# Patient Record
Sex: Male | Born: 1970
Health system: Southern US, Community
[De-identification: ages and names within clinical notes are randomized; demographics above are authoritative.]

## PROBLEM LIST (undated history)

## (undated) DIAGNOSIS — E11621 Type 2 diabetes mellitus with foot ulcer: Secondary | ICD-10-CM

## (undated) DIAGNOSIS — L97502 Non-pressure chronic ulcer of other part of unspecified foot with fat layer exposed: Secondary | ICD-10-CM

## (undated) DIAGNOSIS — E119 Type 2 diabetes mellitus without complications: Secondary | ICD-10-CM

## (undated) HISTORY — DX: Non-pressure chronic ulcer of other part of unspecified foot with fat layer exposed: L97.502

## (undated) HISTORY — DX: Type 2 diabetes mellitus with foot ulcer: E11.621

---

## 2006-06-20 ENCOUNTER — Emergency Department (HOSPITAL_COMMUNITY): Admission: EM | Admit: 2006-06-20 | Discharge: 2006-06-20 | Payer: Self-pay | Admitting: Emergency Medicine

## 2016-09-10 ENCOUNTER — Encounter (HOSPITAL_BASED_OUTPATIENT_CLINIC_OR_DEPARTMENT_OTHER): Payer: Self-pay | Admitting: *Deleted

## 2016-09-10 ENCOUNTER — Emergency Department (HOSPITAL_BASED_OUTPATIENT_CLINIC_OR_DEPARTMENT_OTHER)
Admission: EM | Admit: 2016-09-10 | Discharge: 2016-09-10 | Disposition: A | Discharge status: Home / Self Care | Payer: Self-pay | Attending: Emergency Medicine | Admitting: Emergency Medicine

## 2016-09-10 DIAGNOSIS — F17290 Nicotine dependence, other tobacco product, uncomplicated: Secondary | ICD-10-CM | POA: Insufficient documentation

## 2016-09-10 DIAGNOSIS — E1165 Type 2 diabetes mellitus with hyperglycemia: Secondary | ICD-10-CM | POA: Insufficient documentation

## 2016-09-10 DIAGNOSIS — R739 Hyperglycemia, unspecified: Secondary | ICD-10-CM

## 2016-09-10 DIAGNOSIS — I10 Essential (primary) hypertension: Secondary | ICD-10-CM | POA: Insufficient documentation

## 2016-09-10 LAB — URINALYSIS, ROUTINE W REFLEX MICROSCOPIC
Bilirubin Urine: NEGATIVE
Glucose, UA: >=500 mg/dL — AB
Hgb urine dipstick: NEGATIVE
KETONES UR: NEGATIVE mg/dL
LEUKOCYTES UA: NEGATIVE
NITRITE: NEGATIVE
PH: 5.5 (ref 5.0–8.0)
Protein, ur: NEGATIVE mg/dL
SPECIFIC GRAVITY, URINE: 1.038 — AB (ref 1.005–1.030)

## 2016-09-10 LAB — BASIC METABOLIC PANEL
ANION GAP: 9 (ref 5–15)
BUN: 15 mg/dL (ref 6–20)
CALCIUM: 9.3 mg/dL (ref 8.9–10.3)
CHLORIDE: 98 mmol/L — AB (ref 101–111)
CO2: 27 mmol/L (ref 22–32)
Creatinine, Ser: 0.82 mg/dL (ref 0.61–1.24)
GFR calc non Af Amer: >60 mL/min (ref >60)
Glucose, Bld: 373 mg/dL — ABNORMAL HIGH (ref 65–99)
POTASSIUM: 4.3 mmol/L (ref 3.5–5.1)
Sodium: 134 mmol/L — ABNORMAL LOW (ref 135–145)

## 2016-09-10 LAB — CBC
HEMATOCRIT: 45.5 % (ref 39.0–52.0)
HEMOGLOBIN: 16.3 g/dL (ref 13.0–17.0)
MCH: 31.8 pg (ref 26.0–34.0)
MCHC: 35.8 g/dL (ref 30.0–36.0)
MCV: 88.9 fL (ref 78.0–100.0)
Platelets: 282 10*3/uL (ref 150–400)
RBC: 5.12 MIL/uL (ref 4.22–5.81)
RDW: 12 % (ref 11.5–15.5)
WBC: 11.6 10*3/uL — AB (ref 4.0–10.5)

## 2016-09-10 LAB — URINALYSIS, MICROSCOPIC (REFLEX)

## 2016-09-10 LAB — CBG MONITORING, ED: GLUCOSE-CAPILLARY: 358 mg/dL — AB (ref 65–99)

## 2016-09-10 MED ORDER — METFORMIN HCL 500 MG PO TABS
500.0000 mg | ORAL_TABLET | Freq: Once | ORAL | Status: AC
  Administered 2016-09-10: 500 mg via ORAL
  Filled 2016-09-10: qty 1

## 2016-09-10 MED ORDER — METFORMIN HCL 500 MG PO TABS: 500.0000 mg | ORAL_TABLET | Freq: Every day | ORAL | 0 refills | Status: DC

## 2016-09-10 MED ORDER — HYDROCHLOROTHIAZIDE 25 MG PO TABS
25.0000 mg | ORAL_TABLET | Freq: Once | ORAL | Status: AC
  Administered 2016-09-10: 25 mg via ORAL
  Filled 2016-09-10: qty 1

## 2016-09-10 MED ORDER — HYDROCHLOROTHIAZIDE 25 MG PO TABS: 25.0000 mg | ORAL_TABLET | Freq: Every day | ORAL | 0 refills | Status: DC

## 2016-09-10 MED FILL — metFORMIN HCL 500 MG TABS: 500 | 30 days supply | Qty: 30 | Fill #0

## 2016-09-10 MED FILL — HYDROCHLOROTHIAZIDE 25 MG T: 25 | 30 days supply | Qty: 30 | Fill #0

## 2016-09-10 NOTE — ED Provider Notes (Signed)
[facility] Provider Note   CSN: [id number] Arrival date & time: 09/10/16  1220     History   Chief Complaint Chief Complaint  Patient presents with  . Hyperglycemia    HPI Jason Monroe is a 46 y.o. male.  HPI  46 y.o. male, presents to the Emergency Department today due to pain in bilateral feet. Pt seen by PCP and told he had new onset DM and told to come to ED last week. Noted hyperglycemia and elevated BP. Denies headaches. Notes mild visual changes earlier, that have resolved. Notes increased urinary frequency and thirst. No CP/SOB/ABD pain. No N/V/D. No fevers. Denies pain in general. No meds PTA. Noted extensive FH of DM. No other symptoms noted.    History reviewed. No pertinent past medical history.  There are no active problems to display for this patient.   History reviewed. No pertinent surgical history.     Home Medications    Prior to Admission medications   Not on File    Family History No family history on file.  Social History Social History  Substance Use Topics  . Smoking status: Current Every Day Smoker    Types: Pipe  . Smokeless tobacco: Never Used  . Alcohol use Yes     Allergies   Patient has no known allergies.   Review of Systems Review of Systems ROS reviewed and all are negative for acute change except as noted in the HPI.  Physical Exam Updated Vital Signs BP (!) 183/115   Pulse 86   Temp 98.5 F (36.9 C) (Oral)   Resp 20   Ht 6' (1.829 m)   Wt 111.1 kg (245 lb)   SpO2 99%   BMI 33.23 kg/m   Physical Exam  Constitutional: He is oriented to person, place, and time. Vital signs are normal. He appears well-developed and well-nourished. No distress.  HENT:  Head: Normocephalic and atraumatic.  Right Ear: Hearing, tympanic membrane, external ear and ear canal normal.  Left Ear: Hearing, tympanic membrane, external ear and ear canal normal.  Nose: Nose normal.  Mouth/Throat: Uvula is midline,  oropharynx is clear and moist and mucous membranes are normal. No trismus in the jaw. No oropharyngeal exudate, posterior oropharyngeal erythema or tonsillar abscesses.  Eyes: Pupils are equal, round, and reactive to light. Conjunctivae and EOM are normal.  Neck: Normal range of motion. Neck supple. No tracheal deviation present.  Cardiovascular: Normal rate, regular rhythm, S1 normal, S2 normal, normal heart sounds, intact distal pulses and normal pulses.   Pulmonary/Chest: Effort normal and breath sounds normal. No respiratory distress. He has no decreased breath sounds. He has no wheezes. He has no rhonchi. He has no rales.  Abdominal: Normal appearance and bowel sounds are normal. There is no tenderness.  Musculoskeletal: Normal range of motion.  Neurological: He is alert and oriented to person, place, and time.  Skin: Skin is warm and dry.  Psychiatric: He has a normal mood and affect. His speech is normal and behavior is normal. Thought content normal.  Nursing note and vitals reviewed.  ED Treatments / Results  Labs (all labs ordered are listed, but only abnormal results are displayed) Labs Reviewed  CBC - Abnormal; Notable for the following:       Result Value   WBC 11.6 (*)    All other components within normal limits  BASIC METABOLIC PANEL - Abnormal; Notable for the following:    Sodium 134 (*)    Chloride 98 (*)  Glucose, Bld 373 (*)    All other components within normal limits  URINALYSIS, ROUTINE W REFLEX MICROSCOPIC - Abnormal; Notable for the following:    Specific Gravity, Urine 1.038 (*)    Glucose, UA >=500 (*)    All other components within normal limits  URINALYSIS, MICROSCOPIC (REFLEX) - Abnormal; Notable for the following:    Bacteria, UA RARE (*)    Squamous Epithelial / LPF 0-5 (*)    All other components within normal limits  CBG MONITORING, ED - Abnormal; Notable for the following:    Glucose-Capillary 358 (*)    All other components within normal  limits    EKG  EKG Interpretation None       Radiology No results found.  Procedures Procedures (including critical care time)  Medications Ordered in ED Medications - No data to display   Initial Impression / Assessment and Plan / ED Course  I have reviewed the triage vital signs and the nursing notes.  Pertinent labs & imaging results that were available during my care of the patient were reviewed by me and considered in my medical decision making (see chart for details).  Final Clinical Impressions(s) / ED Diagnoses  {I have reviewed and evaluated the relevant laboratory values.   {I have reviewed the relevant previous healthcare records.  {I obtained HPI from historian. {Patient discussed with supervising physician.  ED Course:  Assessment: Pt is a 46 y.o. male presents to the Emergency Department today due to pain in bilateral feet. Pt seen by PCP and told he had new onset DM and told to come to ED last week. Noted hyperglycemia and elevated BP. Denies headaches. Notes mild visual changes earlier, that have resolved. Notes increased urinary frequency and thirst. No CP/SOB/ABD pain. No N/V/D. No fevers. Denies pain in general. No meds PTA. Noted extensive FH of DM. On exam, pt in NAD. Nontoxic/nonseptic appearing. VSS. Noted HTN with BP 183/115. Afebrile. Lungs CTA. Heart RRR. Abdomen nontender soft. POC glucose 400. CBC unremarkable. BMP unremarkable. No gap.Marland Kitchen UA without ketones. Given Metformin and HCTZ in ED. Plan is to DC home with both medications and follow up with PCP. Encouraged exercise and diet. At time of discharge, Patient is in no acute distress. Vital Signs are stable. Patient is able to ambulate. Patient able to tolerate PO.   Disposition/Plan:  DC Home Additional Verbal discharge instructions given and discussed with patient.  Pt Instructed to f/u with PCP in the next week for evaluation and treatment of symptoms. Return precautions given Pt acknowledges and  agrees with plan  Supervising Physician Jerelyn Scott, MD  Final diagnoses:  Hyperglycemia  Hypertension, unspecified type    New Prescriptions New Prescriptions   No medications on file     Audry Pili, Cordelia Poche 09/10/16 1446    Jerelyn Scott, MD 09/10/16 1453

## 2016-09-10 NOTE — ED Triage Notes (Signed)
States he went to see his MD a week ago for pain in his feet. He was told he has new onset diabetes and was told to come to the ED. He has not had a chance to come until today.

## 2016-09-10 NOTE — Discharge Instructions (Signed)
Please read and follow all provided instructions.  Your diagnoses today include:  1. Hyperglycemia   2. Hypertension, unspecified type     Tests performed today include: Vital signs. See below for your results today.   Medications prescribed:  Take as prescribed   Home care instructions:  Follow any educational materials contained in this packet.  Follow-up instructions: Please follow-up with your primary care provider for further evaluation of symptoms and treatment   Return instructions:  Please return to the Emergency Department if you do not get better, if you get worse, or new symptoms OR  - Fever (temperature greater than 101.44F)  - Bleeding that does not stop with holding pressure to the area    -Severe pain (please note that you may be more sore the day after your accident)  - Chest Pain  - Difficulty breathing  - Severe nausea or vomiting  - Inability to tolerate food and liquids  - Passing out  - Skin becoming red around your wounds  - Change in mental status (confusion or lethargy)  - New numbness or weakness    Please return if you have any other emergent concerns.  Additional Information:  Your vital signs today were: BP (!) 183/115    Pulse 86    Temp 98.5 F (36.9 C) (Oral)    Resp 20    Ht 6' (1.829 m)    Wt 111.1 kg (245 lb)    SpO2 99%    BMI 33.23 kg/m  If your blood pressure (BP) was elevated above 135/85 this visit, please have this repeated by your doctor within one month. ---------------

## 2017-10-23 ENCOUNTER — Emergency Department (HOSPITAL_COMMUNITY): Payer: Self-pay

## 2017-10-23 ENCOUNTER — Other Ambulatory Visit: Payer: Self-pay

## 2017-10-23 ENCOUNTER — Encounter (HOSPITAL_COMMUNITY): Payer: Self-pay | Admitting: Emergency Medicine

## 2017-10-23 ENCOUNTER — Inpatient Hospital Stay (HOSPITAL_COMMUNITY)
Admission: EM | Admit: 2017-10-23 | Discharge: 2017-10-28 | DRG: 854 | Disposition: A | Discharge status: Home / Self Care | Payer: Self-pay | Attending: Internal Medicine | Admitting: Internal Medicine

## 2017-10-23 DIAGNOSIS — L97519 Non-pressure chronic ulcer of other part of right foot with unspecified severity: Secondary | ICD-10-CM

## 2017-10-23 DIAGNOSIS — E440 Moderate protein-calorie malnutrition: Secondary | ICD-10-CM | POA: Diagnosis present

## 2017-10-23 DIAGNOSIS — L02611 Cutaneous abscess of right foot: Secondary | ICD-10-CM | POA: Diagnosis present

## 2017-10-23 DIAGNOSIS — L97502 Non-pressure chronic ulcer of other part of unspecified foot with fat layer exposed: Secondary | ICD-10-CM

## 2017-10-23 DIAGNOSIS — L0390 Cellulitis, unspecified: Secondary | ICD-10-CM

## 2017-10-23 DIAGNOSIS — E1143 Type 2 diabetes mellitus with diabetic autonomic (poly)neuropathy: Secondary | ICD-10-CM

## 2017-10-23 DIAGNOSIS — F17290 Nicotine dependence, other tobacco product, uncomplicated: Secondary | ICD-10-CM | POA: Diagnosis present

## 2017-10-23 DIAGNOSIS — Z833 Family history of diabetes mellitus: Secondary | ICD-10-CM

## 2017-10-23 DIAGNOSIS — Z888 Allergy status to other drugs, medicaments and biological substances status: Secondary | ICD-10-CM

## 2017-10-23 DIAGNOSIS — R609 Edema, unspecified: Secondary | ICD-10-CM | POA: Diagnosis present

## 2017-10-23 DIAGNOSIS — E118 Type 2 diabetes mellitus with unspecified complications: Secondary | ICD-10-CM

## 2017-10-23 DIAGNOSIS — H02401 Unspecified ptosis of right eyelid: Secondary | ICD-10-CM

## 2017-10-23 DIAGNOSIS — L089 Local infection of the skin and subcutaneous tissue, unspecified: Secondary | ICD-10-CM

## 2017-10-23 DIAGNOSIS — L97419 Non-pressure chronic ulcer of right heel and midfoot with unspecified severity: Secondary | ICD-10-CM

## 2017-10-23 DIAGNOSIS — E11628 Type 2 diabetes mellitus with other skin complications: Secondary | ICD-10-CM | POA: Diagnosis present

## 2017-10-23 DIAGNOSIS — L97412 Non-pressure chronic ulcer of right heel and midfoot with fat layer exposed: Secondary | ICD-10-CM

## 2017-10-23 DIAGNOSIS — Z87891 Personal history of nicotine dependence: Secondary | ICD-10-CM

## 2017-10-23 DIAGNOSIS — E11621 Type 2 diabetes mellitus with foot ulcer: Secondary | ICD-10-CM | POA: Diagnosis present

## 2017-10-23 DIAGNOSIS — E1140 Type 2 diabetes mellitus with diabetic neuropathy, unspecified: Secondary | ICD-10-CM | POA: Diagnosis present

## 2017-10-23 DIAGNOSIS — A419 Sepsis, unspecified organism: Principal | ICD-10-CM | POA: Diagnosis present

## 2017-10-23 DIAGNOSIS — Z794 Long term (current) use of insulin: Secondary | ICD-10-CM

## 2017-10-23 DIAGNOSIS — E1165 Type 2 diabetes mellitus with hyperglycemia: Secondary | ICD-10-CM | POA: Diagnosis present

## 2017-10-23 DIAGNOSIS — Z6829 Body mass index (BMI) 29.0-29.9, adult: Secondary | ICD-10-CM

## 2017-10-23 HISTORY — DX: Non-pressure chronic ulcer of other part of unspecified foot with fat layer exposed: L97.502

## 2017-10-23 HISTORY — DX: Type 2 diabetes mellitus without complications: E11.9

## 2017-10-23 HISTORY — DX: Type 2 diabetes mellitus with foot ulcer: E11.621

## 2017-10-23 LAB — CBC
HCT: 42.6 % (ref 39.0–52.0)
Hemoglobin: 14.4 g/dL (ref 13.0–17.0)
MCH: 30.3 pg (ref 26.0–34.0)
MCHC: 33.8 g/dL (ref 30.0–36.0)
MCV: 89.7 fL (ref 78.0–100.0)
PLATELETS: 366 10*3/uL (ref 150–400)
RBC: 4.75 MIL/uL (ref 4.22–5.81)
RDW: 10.7 % — AB (ref 11.5–15.5)
WBC: 27.7 10*3/uL — AB (ref 4.0–10.5)

## 2017-10-23 LAB — COMPREHENSIVE METABOLIC PANEL
ALT: 22 U/L (ref 0–44)
AST: 22 U/L (ref 15–41)
Albumin: 2.9 g/dL — ABNORMAL LOW (ref 3.5–5.0)
Alkaline Phosphatase: 101 U/L (ref 38–126)
Anion gap: 13 (ref 5–15)
BUN: 9 mg/dL (ref 6–20)
CO2: 25 mmol/L (ref 22–32)
Calcium: 8.6 mg/dL — ABNORMAL LOW (ref 8.9–10.3)
Chloride: 92 mmol/L — ABNORMAL LOW (ref 98–111)
Creatinine, Ser: 0.96 mg/dL (ref 0.61–1.24)
GFR calc Af Amer: >60 mL/min (ref >60)
Glucose, Bld: 308 mg/dL — ABNORMAL HIGH (ref 70–99)
Potassium: 4.1 mmol/L (ref 3.5–5.1)
Sodium: 130 mmol/L — ABNORMAL LOW (ref 135–145)
Total Bilirubin: 1.4 mg/dL — ABNORMAL HIGH (ref 0.3–1.2)
Total Protein: 7.6 g/dL (ref 6.5–8.1)

## 2017-10-23 LAB — LACTIC ACID, PLASMA
LACTIC ACID, VENOUS: 0.9 mmol/L (ref 0.5–1.9)
LACTIC ACID, VENOUS: 1.2 mmol/L (ref 0.5–1.9)

## 2017-10-23 LAB — MRSA PCR SCREENING: MRSA BY PCR: NEGATIVE

## 2017-10-23 LAB — URINALYSIS, ROUTINE W REFLEX MICROSCOPIC
Bacteria, UA: NONE SEEN
Bilirubin Urine: NEGATIVE
Glucose, UA: >=500 mg/dL — AB
HGB URINE DIPSTICK: NEGATIVE
Ketones, ur: 80 mg/dL — AB
LEUKOCYTES UA: NEGATIVE
NITRITE: NEGATIVE
Protein, ur: 30 mg/dL — AB
SPECIFIC GRAVITY, URINE: 1.021 (ref 1.005–1.030)
pH: 6 (ref 5.0–8.0)

## 2017-10-23 LAB — HEMOGLOBIN A1C
Hgb A1c MFr Bld: 10.7 % — ABNORMAL HIGH (ref 4.8–5.6)
MEAN PLASMA GLUCOSE: 260.39 mg/dL

## 2017-10-23 LAB — I-STAT CG4 LACTIC ACID, ED: LACTIC ACID, VENOUS: 1.99 mmol/L — AB (ref 0.5–1.9)

## 2017-10-23 LAB — CBG MONITORING, ED: Glucose-Capillary: 284 mg/dL — ABNORMAL HIGH (ref 70–99)

## 2017-10-23 LAB — LIPASE, BLOOD: Lipase: 22 U/L (ref 11–51)

## 2017-10-23 LAB — GLUCOSE, CAPILLARY
GLUCOSE-CAPILLARY: 231 mg/dL — AB (ref 70–99)
Glucose-Capillary: 250 mg/dL — ABNORMAL HIGH (ref 70–99)

## 2017-10-23 MED ORDER — SODIUM CHLORIDE 0.9 % IV BOLUS (SEPSIS): 1000.0000 mL | Freq: Once | INTRAVENOUS | Status: DC

## 2017-10-23 MED ORDER — CIPROFLOXACIN IN D5W 400 MG/200ML IV SOLN
400.0000 mg | Freq: Once | INTRAVENOUS | Status: DC
  Filled 2017-10-23: qty 200

## 2017-10-23 MED ORDER — HYDROCODONE-ACETAMINOPHEN 5-325 MG PO TABS: 1.0000 | ORAL_TABLET | ORAL | Status: DC | PRN

## 2017-10-23 MED ORDER — ENOXAPARIN SODIUM 40 MG/0.4ML ~~LOC~~ SOLN
40.0000 mg | SUBCUTANEOUS | Status: DC
  Administered 2017-10-23 – 2017-10-28 (×6): 40 mg via SUBCUTANEOUS
  Filled 2017-10-23 (×6): qty 0.4

## 2017-10-23 MED ORDER — SODIUM CHLORIDE 0.9 % IV BOLUS (SEPSIS)
1000.0000 mL | Freq: Once | INTRAVENOUS | Status: AC
  Administered 2017-10-23: 1000 mL via INTRAVENOUS

## 2017-10-23 MED ORDER — CHLORHEXIDINE GLUCONATE 4 % EX LIQD: 60.0000 mL | Freq: Once | CUTANEOUS | Status: DC

## 2017-10-23 MED ORDER — CLINDAMYCIN PHOSPHATE 600 MG/50ML IV SOLN
600.0000 mg | Freq: Once | INTRAVENOUS | Status: DC
  Filled 2017-10-23: qty 50

## 2017-10-23 MED ORDER — ONDANSETRON HCL 4 MG/2ML IJ SOLN: 4.0000 mg | Freq: Four times a day (QID) | INTRAMUSCULAR | Status: DC | PRN

## 2017-10-23 MED ORDER — CHLORHEXIDINE GLUCONATE 4 % EX LIQD
60.0000 mL | Freq: Once | CUTANEOUS | Status: AC
  Administered 2017-10-24: 4 via TOPICAL

## 2017-10-23 MED ORDER — INSULIN ASPART 100 UNIT/ML ~~LOC~~ SOLN
0.0000 [IU] | Freq: Every day | SUBCUTANEOUS | Status: DC
  Administered 2017-10-23 – 2017-10-24 (×2): 2 [IU] via SUBCUTANEOUS

## 2017-10-23 MED ORDER — SODIUM CHLORIDE 0.9 % IV SOLN
1000.0000 mL | INTRAVENOUS | Status: DC
  Administered 2017-10-23: 1000 mL via INTRAVENOUS

## 2017-10-23 MED ORDER — SODIUM CHLORIDE 0.9 % IV SOLN: 1000.0000 mL | INTRAVENOUS | Status: DC

## 2017-10-23 MED ORDER — KETOROLAC TROMETHAMINE 30 MG/ML IJ SOLN
30.0000 mg | Freq: Once | INTRAMUSCULAR | Status: AC
  Administered 2017-10-23: 30 mg via INTRAVENOUS
  Filled 2017-10-23: qty 1

## 2017-10-23 MED ORDER — METRONIDAZOLE IN NACL 5-0.79 MG/ML-% IV SOLN
500.0000 mg | Freq: Three times a day (TID) | INTRAVENOUS | Status: DC
  Administered 2017-10-23 – 2017-10-24 (×3): 500 mg via INTRAVENOUS
  Filled 2017-10-23 (×4): qty 100

## 2017-10-23 MED ORDER — SODIUM CHLORIDE 0.9 % IV SOLN
2.0000 g | Freq: Two times a day (BID) | INTRAVENOUS | Status: DC
  Administered 2017-10-23 – 2017-10-24 (×2): 2 g via INTRAVENOUS
  Filled 2017-10-23 (×3): qty 2

## 2017-10-23 MED ORDER — LIVING WELL WITH DIABETES BOOK
Freq: Once | Status: AC
  Administered 2017-10-23: 18:00:00
  Filled 2017-10-23: qty 1

## 2017-10-23 MED ORDER — ONDANSETRON HCL 4 MG PO TABS: 4.0000 mg | ORAL_TABLET | Freq: Four times a day (QID) | ORAL | Status: DC | PRN

## 2017-10-23 MED ORDER — ACETAMINOPHEN 650 MG RE SUPP: 650.0000 mg | Freq: Four times a day (QID) | RECTAL | Status: DC | PRN

## 2017-10-23 MED ORDER — POVIDONE-IODINE 10 % EX SWAB: 2.0000 "application " | Freq: Once | CUTANEOUS | Status: DC

## 2017-10-23 MED ORDER — MORPHINE SULFATE (PF) 4 MG/ML IV SOLN: 4.0000 mg | Freq: Once | INTRAVENOUS | Status: DC

## 2017-10-23 MED ORDER — INSULIN ASPART 100 UNIT/ML ~~LOC~~ SOLN
0.0000 [IU] | Freq: Three times a day (TID) | SUBCUTANEOUS | Status: DC
  Administered 2017-10-23: 5 [IU] via SUBCUTANEOUS
  Administered 2017-10-24 (×2): 3 [IU] via SUBCUTANEOUS
  Administered 2017-10-24 – 2017-10-25 (×3): 5 [IU] via SUBCUTANEOUS
  Administered 2017-10-25: 3 [IU] via SUBCUTANEOUS
  Administered 2017-10-26: 5 [IU] via SUBCUTANEOUS
  Administered 2017-10-26 – 2017-10-27 (×2): 3 [IU] via SUBCUTANEOUS

## 2017-10-23 MED ORDER — ONDANSETRON HCL 4 MG/2ML IJ SOLN
4.0000 mg | Freq: Once | INTRAMUSCULAR | Status: AC
  Administered 2017-10-23: 4 mg via INTRAVENOUS
  Filled 2017-10-23: qty 2

## 2017-10-23 MED ORDER — ACETAMINOPHEN 325 MG PO TABS
650.0000 mg | ORAL_TABLET | Freq: Four times a day (QID) | ORAL | Status: DC | PRN
  Administered 2017-10-23 – 2017-10-24 (×2): 650 mg via ORAL
  Filled 2017-10-23 (×2): qty 2

## 2017-10-23 MED ORDER — INSULIN GLARGINE 100 UNIT/ML ~~LOC~~ SOLN
15.0000 [IU] | Freq: Every day | SUBCUTANEOUS | Status: DC
  Administered 2017-10-23 – 2017-10-24 (×2): 15 [IU] via SUBCUTANEOUS
  Filled 2017-10-23 (×3): qty 0.15

## 2017-10-23 MED ORDER — INSULIN STARTER KIT- SYRINGES (ENGLISH)
1.0000 | Freq: Once | Status: AC
  Administered 2017-10-23: 1
  Filled 2017-10-23: qty 1

## 2017-10-23 NOTE — Consult Note (Signed)
ORTHOPAEDIC CONSULTATION  REQUESTING PHYSICIAN: Jason Monroe, Nischal, MD  Chief Complaint: Abscess ulceration right foot.  HPI: Jason Monroe is a 47 y.o. male who presents with uncontrolled diabetic insensate neuropathy with abscess ulceration purulent drainage from a 4 foot ulcer.  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  Past Medical History:  Diagnosis Date  . Diabetes mellitus without complication (HCC)    History reviewed. No pertinent surgical history. Social History   Socioeconomic History  . Marital status: Single    Spouse name: Not on file  . Number of children: Not on file  . Years of education: Not on file  . Highest education level: Not on file  Occupational History  . Not on file  Social Needs  . Financial resource strain: Not on file  . Food insecurity:    Worry: Not on file    Inability: Not on file  . Transportation needs:    Medical: Not on file    Non-medical: Not on file  Tobacco Use  . Smoking status: Current Every Day Smoker    Types: Pipe  . Smokeless tobacco: Never Used  Substance and Sexual Activity  . Alcohol use: Yes  . Drug use: No  . Sexual activity: Not on file  Lifestyle  . Physical activity:    Days per week: Not on file    Minutes per session: Not on file  . Stress: Not on file  Relationships  . Social connections:    Talks on phone: Not on file    Gets together: Not on file    Attends religious service: Not on file    Active member of club or organization: Not on file    Attends meetings of clubs or organizations: Not on file    Relationship status: Not on file  Other Topics Concern  . Not on file  Social History Narrative  . Not on file   No family history on file. - negative except otherwise stated in the family history section Allergies  Allergen Reactions  . Metformin And Related Hives   Prior to Admission medications   Medication Sig Start Date End Date Taking?  Authorizing Provider  ibuprofen (ADVIL,MOTRIN) 200 MG tablet Take 600 mg by mouth every 12 (twelve) hours.    Yes [provider]  IBUPROFEN-DIPHENHYDRAMINE CIT PO Take 1 tablet by mouth at bedtime.   Yes [provider]  hydrochlorothiazide (HYDRODIURIL) 25 MG tablet Take 1 tablet (25 mg total) by mouth daily. Patient not taking: Reported on 10/23/2017 09/10/16   Audry PiliMohr, Tyler, PA-C  metFORMIN (GLUCOPHAGE) 500 MG tablet Take 1 tablet (500 mg total) by mouth daily with breakfast. Patient not taking: Reported on 10/23/2017 09/10/16   Audry PiliMohr, Tyler, PA-C   Dg Foot Complete Right  Result Date: 10/23/2017 CLINICAL DATA:  Acute right foot pain. EXAM: RIGHT FOOT COMPLETE - 3+ VIEW COMPARISON:  None. FINDINGS: There is no evidence of fracture or dislocation. There is no evidence of arthropathy or other focal bone abnormality. Soft tissues are unremarkable. IMPRESSION: Normal right foot. Electronically Signed   By: Lupita RaiderJames  Green Jr, M.D.   On: 10/23/2017 11:56   - pertinent xrays, CT, MRI studies were reviewed and independently interpreted  Positive ROS: All other systems have been reviewed and were otherwise negative with the exception of those mentioned in the HPI and as above.  Physical Exam: General: Alert, no acute distress Psychiatric: Patient is competent for consent  with normal mood and affect Lymphatic: No axillary or cervical lymphadenopathy Cardiovascular: No pedal edema Respiratory: No cyanosis, no use of accessory musculature GI: No organomegaly, abdomen is soft and non-tender    Images:  @ENCIMAGES @  Labs:  Lab Results  Component Value Date   HGBA1C 10.7 (H) 10/23/2017    Lab Results  Component Value Date   ALBUMIN 2.9 (L) 10/23/2017    Neurologic: Patient does not have protective sensation bilateral lower extremities.   MUSCULOSKELETAL:   Skin: Examination patient has cellulitis and warmth to the right foot extending up to the ankle.  There is an abscess  on the plantar aspect of the right foot with purulent drainage from the ulcer.  The ulcer is approximately 3 cm in diameter.  Patient has a good dorsalis pedis and posterior tibial pulse.  No arterial insufficiency.  Patient has been full weightbearing.  He states that he has stopped smoking.  Patient has uncontrolled type 2 diabetes with moderate protein caloric malnutrition.  Assessment: Assessment: Diabetic insensate neuropathy with protein caloric malnutrition with abscess and ulceration right foot.  Plan: Plan: We will plan for irrigation and debridement of the abscess right foot placement of an installation or incisional wound VAC.  Risk and benefits were discussed including persistent infection need for additional surgery.  Patient states he understands wished to proceed at this time.  Plan for surgery tomorrow Sunday morning approximately 830.  Thank you for the consult and the opportunity to see [name], MD [facility] [phone] 5:02 PM

## 2017-10-23 NOTE — H&P (Signed)
Date: 10/23/2017               Patient Name:  Jason Monroe MRN: [id number]  DOB: 17-May-1970 Age / Sex: 47 y.o., male   PCP: Patient, No Pcp Per         Medical Service: Internal Medicine Teaching Service         Attending Physician: Dr. Gerhard Munch, MD    First Contact: Dr. Judeth Cornfield Pager: [phone]  Second Contact: Dr. Levora Dredge Pager: [phone]       After Hours (After 5p/  First Contact Pager: [phone]  weekends / holidays): Second Contact Pager: [phone]   Chief Complaint:  Foot ulcer  History of Present Illness:  Jason Monroe is a 47 yo w/ PMH of T2DM not on insulin presenting to the ED with complaint of right foot ulcer. He states he was in his usual state of health until about 2 weeks ago when he noticed a small blister on the bottom of his right foot. He states he tried to keep it clean and treat with OTC medication until about 3 days ago when he began to experience significant worsening of his foot pain with erythema, edema and foul odor. He also began to experience fever and nausea. He states he has a very busy work schedule working 12 hours a day at [employer] and he was working on his feet all day recently. He has history of diabetic neuropathy with loss of sensation on the bottom of his feet and he thought he could bear through the pain until he could not handle the pain anymore and came to the hospital. Denies chest pain, palpitations, dyspnea, diarrhea, constipation, headaches.  He was diagnosed with T2DM couple years ago and started on metformin and glipizide. He developed rashes after taking these medications and started managing his diabetes on diet alone. He also stopped following up with his PCP because he did not like the care he was getting.   In the ED, he was found to have elevated white count of 27.7 He was started on ciprofloxacin 400mg  and clindamycin and started on fluids. X-ray of foot taken, read as normal right foot.  Meds:  Current Meds    Medication Sig  . ibuprofen (ADVIL,MOTRIN) 200 MG tablet Take 600 mg by mouth every 12 (twelve) hours.   Jason Monroe CIT PO Take 1 tablet by mouth at bedtime.   Allergies: Allergies as of 10/23/2017 - Review Complete 10/23/2017  Allergen Reaction Noted  . Metformin and related Hives 10/23/2017   Past Medical History:  Diagnosis Date  . Diabetes mellitus without complication (HCC)     Family History: Maternal grandfather with type 1 dm with complications including amputation. Multiple family members with hx of cardiac disease requiring stents. Sister with arrhythmia.   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 States he quit smoking 10 years ago. Denies EtOH, IVDU.  Review of Systems: A complete ROS was negative except as per HPI.  Physical Exam: Blood pressure (!) 169/99, pulse 98, temperature (!) 100.4 F (38 C), temperature source Rectal, resp. rate 16, height 6' (1.829 m), weight 99.8 kg, SpO2 98 %. Physical Exam  Constitutional: He is oriented to person, place, and time. He appears distressed.  Tearful male  HENT:  Head: Normocephalic and atraumatic.  Mouth/Throat: Oropharynx is clear and moist. No oropharyngeal exudate.  Eyes: Conjunctivae and EOM are normal. No scleral icterus.  Right eye ptosis  Neck:  Normal range of motion. Neck supple. No JVD present. No thyromegaly present.  Cardiovascular: Normal rate, regular rhythm, normal heart sounds and intact distal pulses.  Pedal pulses palpable  Pulmonary/Chest: Effort normal and breath sounds normal. No respiratory distress. He has no wheezes. He has no rales.  Abdominal: Soft. Bowel sounds are normal. He exhibits no distension. There is no tenderness. There is no guarding.  Musculoskeletal: Normal range of motion. He exhibits edema and tenderness. He exhibits no deformity.  Right foot significantly swollen with warmth to touch. Malodorous 4x3cm ulcer on bottom of his foot  pictured below  Neurological: He is alert and oriented to person, place, and time. No cranial nerve deficit. GCS score is 15.  Skin: Skin is warm and dry. He is not diaphoretic.  Psychiatric: Mood, memory and judgment normal.        Assessment & Plan by Problem: Jason Monroe is a 47 yo M w/ PMH of T2DM presenting with right foot ulcer. He appears to have tried to self-manage with diabetes unsuccessfully and developed lower extremity numbness leading to plantar foot ulcer that became infected before he realized the severity. Now presenting with systemic signs of illness, with fever and nausea. He will need systemic antibiotics and may need OR debridement vs wound care for his ulcer. We will also try to optimize his diabetic regimen before discharge and set him up with a PCP.  Infected Right foot ulcer complicated by diabetes Presenting with infected right foot ulcer complicated by uncontrolled diabetes with leukocytosis and systemic symptoms. Right foot X-ray does not show signs of osteomyelitis but will defer to ortho for recommendations. Wbc 27.7 Lactate 1.99 - Sepsis workup: Trend lactate q3hr - START Metronidazole 500mg  q8hr, Cefepime 2g q12hr - STOP Cipro, Clinda - c/w IVF - Zofran 4mg  q6h PRN for nausea - Hydrocodone-acetaminophen q4hr for pain - Ortho consult, appreciate recs - Wound care consult  Uncontrolled T2DM Allergic to metformin and glipizide. Managed with diet and exercise. Random glucose on admission 308. Hgb A1c measured today at 10.7. Will need to assess daily insulin requirements - sliding scale insulin aspart - start Lantus 15 units today  DVT proph: Lovenox 40mg  Diet: Diabetic diet Code: FULL  Dispo: Admit patient to Inpatient with expected length of stay greater than 2 midnights.  Signed: Theotis Monroe, Jason Velasques K, MD 10/23/2017, 1:42 PM  Pager: [phone]

## 2017-10-23 NOTE — Progress Notes (Signed)
Inpatient Diabetes Program Recommendations  AACE/ADA: New Consensus Statement on Inpatient Glycemic Control (2015)  Target Ranges:  Prepandial:   less than 140 mg/dL      Peak postprandial:   less than 180 mg/dL (1-2 hours)      Critically ill patients:  140 - 180 mg/dL   Lab Results  Component Value Date   GLUCAP 284 (H) 10/23/2017   HGBA1C 10.7 (H) 10/23/2017    Review of Glycemic Control  Diabetes history: DM2 Outpatient Diabetes medications: Metformin + Glypizide (patient not taking preadmission due to rash) Current orders for Inpatient glycemic control: Lantus 15 units daily + Novolog moderate correction tid + hs  Inpatient Diabetes Program Recommendations:    Ordered Living Well With Diabetes book and insulin starter kit with syringes.  Spoke with pt about A1C results with them 10.7 (average CBG 260 over the past 2-3 months)and explained what an A1C is, basic pathophysiology of DM RNs to provide ongoing basic DM education at bedside with this patient. Have ordered educational booklet, insulin starter kit, and DM videos. Have also placed RD consult for DM diet education for this patient.   Patient states he has been trying to control his diabetes with diet only and has limited sugary drinks and attempts to limit carbohydrates but has been craving with the increased blood sugars. Patient currently does not have insurance so when discharged, Novolin Relion insulin 70/30 bid would be more affordable and discussed that option with patient.Patient willing to purchase insulin from [place]. Will follow.  Thank you, Nani Gasser. Syris Brookens, RN, MSN, CDE  Diabetes Coordinator Inpatient Glycemic Control Team Team Pager [phone] (8am-5pm) 10/23/2017 4:42 PM

## 2017-10-23 NOTE — Progress Notes (Signed)
CSW acknowledges consult for assess medications for discharge. For further assistance with this matter please consult RNCM. No further CSW needs at this time. CSW will sign off.   Claude MangesKierra S. Takeira Yanes, MSW, LCSW-A Emergency Department Clinical Social Worker [phone]

## 2017-10-23 NOTE — Progress Notes (Signed)
1445 Received pt from ED, A&O x4. Right forefoot ulcer noted, with swelling noted up to the ankle. Min assist with transfers, pt uses the cane.  1800 Pt signed consent for surgery tom.

## 2017-10-23 NOTE — Progress Notes (Signed)
ANTIBIOTIC CONSULT NOTE - INITIAL  Pharmacy Consult for Cefepime Indication: diabtetic foot ulcer  Allergies  Allergen Reactions  . Metformin And Related Hives    Patient Measurements: Height: 6' (182.9 cm) Weight: 220 lb (99.8 kg) IBW/kg (Calculated) : 77.6 Adjusted Body Weight:    Vital Signs: Temp: 100.4 F (38 C) (08/24 1110) Temp Source: Rectal (08/24 1110) BP: 169/99 (08/24 1110) Pulse Rate: 98 (08/24 1110) Intake/Output from previous day: No intake/output data recorded. Intake/Output from this shift: No intake/output data recorded.  Labs: Recent Labs    10/23/17 1015  WBC 27.7*  HGB 14.4  PLT 366  CREATININE 0.96   Estimated Creatinine Clearance: 118.9 mL/min (by C-G formula based on SCr of 0.96 mg/dL). No results for input(s): VANCOTROUGH, VANCOPEAK, VANCORANDOM, GENTTROUGH, GENTPEAK, GENTRANDOM, TOBRATROUGH, TOBRAPEAK, TOBRARND, AMIKACINPEAK, AMIKACINTROU, AMIKACIN in the last 72 hours.   Microbiology: No results found for this or any previous visit (from the past 720 hour(s)).  Medical History: Past Medical History:  Diagnosis Date  . Diabetes mellitus without complication (HCC)    Assessment: CC/HPI: foot infection, R foot ulcer with increasing pain, redness, and swelling  PMH: diabetic neuropathy, DM (stopped meds)  ID: Suspected diabetic foot infection. Tmax 1004. WBC 27.7. LA 1.99  Goal of Therapy:  Eradication of infection  Plan:  Cefepime 2g IV q 12 hrs  Shirlean Berman S. Merilynn Finlandobertson, PharmD, BCPS Clinical Staff Pharmacist Pager [phone]  Misty Stanleyobertson, Krisalyn Yankowski Stillinger 10/23/2017,2:15 PM

## 2017-10-23 NOTE — H&P (View-Only) (Signed)
ORTHOPAEDIC CONSULTATION  REQUESTING PHYSICIAN: Earl LagosNarendra, Nischal, MD  Chief Complaint: Abscess ulceration right foot.  HPI: Jason Monroe is a 47 y.o. male who presents with uncontrolled diabetic insensate neuropathy with abscess ulceration purulent drainage from a 4 foot ulcer.  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  Past Medical History:  Diagnosis Date  . Diabetes mellitus without complication (HCC)    History reviewed. No pertinent surgical history. Social History   Socioeconomic History  . Marital status: Single    Spouse name: Not on file  . Number of children: Not on file  . Years of education: Not on file  . Highest education level: Not on file  Occupational History  . Not on file  Social Needs  . Financial resource strain: Not on file  . Food insecurity:    Worry: Not on file    Inability: Not on file  . Transportation needs:    Medical: Not on file    Non-medical: Not on file  Tobacco Use  . Smoking status: Current Every Day Smoker    Types: Pipe  . Smokeless tobacco: Never Used  Substance and Sexual Activity  . Alcohol use: Yes  . Drug use: No  . Sexual activity: Not on file  Lifestyle  . Physical activity:    Days per week: Not on file    Minutes per session: Not on file  . Stress: Not on file  Relationships  . Social connections:    Talks on phone: Not on file    Gets together: Not on file    Attends religious service: Not on file    Active member of club or organization: Not on file    Attends meetings of clubs or organizations: Not on file    Relationship status: Not on file  Other Topics Concern  . Not on file  Social History Narrative  . Not on file   No family history on file. - negative except otherwise stated in the family history section Allergies  Allergen Reactions  . Metformin And Related Hives   Prior to Admission medications   Medication Sig Start Date End Date Taking?  Authorizing Provider  ibuprofen (ADVIL,MOTRIN) 200 MG tablet Take 600 mg by mouth every 12 (twelve) hours.    Yes [provider]  IBUPROFEN-DIPHENHYDRAMINE CIT PO Take 1 tablet by mouth at bedtime.   Yes [provider]  hydrochlorothiazide (HYDRODIURIL) 25 MG tablet Take 1 tablet (25 mg total) by mouth daily. Patient not taking: Reported on 10/23/2017 09/10/16   Audry PiliMohr, Tyler, PA-C  metFORMIN (GLUCOPHAGE) 500 MG tablet Take 1 tablet (500 mg total) by mouth daily with breakfast. Patient not taking: Reported on 10/23/2017 09/10/16   Audry PiliMohr, Tyler, PA-C   Dg Foot Complete Right  Result Date: 10/23/2017 CLINICAL DATA:  Acute right foot pain. EXAM: RIGHT FOOT COMPLETE - 3+ VIEW COMPARISON:  None. FINDINGS: There is no evidence of fracture or dislocation. There is no evidence of arthropathy or other focal bone abnormality. Soft tissues are unremarkable. IMPRESSION: Normal right foot. Electronically Signed   By: Lupita RaiderJames  Green Jr, M.D.   On: 10/23/2017 11:56   - pertinent xrays, CT, MRI studies were reviewed and independently interpreted  Positive ROS: All other systems have been reviewed and were otherwise negative with the exception of those mentioned in the HPI and as above.  Physical Exam: General: Alert, no acute distress Psychiatric: Patient is competent for consent  with normal mood and affect Lymphatic: No axillary or cervical lymphadenopathy Cardiovascular: No pedal edema Respiratory: No cyanosis, no use of accessory musculature GI: No organomegaly, abdomen is soft and non-tender    Images:  @ENCIMAGES @  Labs:  Lab Results  Component Value Date   HGBA1C 10.7 (H) 10/23/2017    Lab Results  Component Value Date   ALBUMIN 2.9 (L) 10/23/2017    Neurologic: Patient does not have protective sensation bilateral lower extremities.   MUSCULOSKELETAL:   Skin: Examination patient has cellulitis and warmth to the right foot extending up to the ankle.  There is an abscess  on the plantar aspect of the right foot with purulent drainage from the ulcer.  The ulcer is approximately 3 cm in diameter.  Patient has a good dorsalis pedis and posterior tibial pulse.  No arterial insufficiency.  Patient has been full weightbearing.  He states that he has stopped smoking.  Patient has uncontrolled type 2 diabetes with moderate protein caloric malnutrition.  Assessment: Assessment: Diabetic insensate neuropathy with protein caloric malnutrition with abscess and ulceration right foot.  Plan: Plan: We will plan for irrigation and debridement of the abscess right foot placement of an installation or incisional wound VAC.  Risk and benefits were discussed including persistent infection need for additional surgery.  Patient states he understands wished to proceed at this time.  Plan for surgery tomorrow Sunday morning approximately 830.  Thank you for the consult and the opportunity to see Mr. Man Effertz, MD [facility] [phone] 5:02 PM

## 2017-10-23 NOTE — ED Provider Notes (Addendum)
[facility] EMERGENCY DEPARTMENT Provider Note   CSN: [id number] Arrival date & time: 10/23/17  4098     History   Chief Complaint Chief Complaint  Patient presents with  . Abscess  . Wound Infection  . Foot Pain  . Nausea    HPI Jason Monroe is a 47 y.o. male.  The history is provided by the patient and a parent. No language interpreter was used.  Abscess  Foot Pain      47 year old male with history of diabetes presenting for evaluation of foot infection.  Patient report for more than a week he has had an ulcer on the sole of his right foot that has become increasingly more painful.  He described pain as an achy throbbing sensation, radiates up his leg now with increased redness and swelling.  He also endorsed persistent nausea, having fever, chills and diaphoresis.  Endorsed generalized weakness.  He tries taking ibuprofen at home as well as using antiseptic cream with minimal improvement.  He felt that his nausea is worse than his pain but admits to having history of diabetic neuropathy and does not feel his foot much.  He mentioned he is trying to control his diabetes with diet and exercise.  He was initially started on metformin and glipizide 6 months ago but after taking the medication he developed a rash and having it in any of his medication since.  He does not normally check his blood sugar.  Remote history of tobacco and alcohol use.  He is up-to-date with tetanus.  His symptoms currently moderate to severe.  He denies any specific injury causing his foot pain.  Past Medical History:  Diagnosis Date  . Diabetes mellitus without complication (HCC)     There are no active problems to display for this patient.   History reviewed. No pertinent surgical history.      Home Medications    Prior to Admission medications   Medication Sig Start Date End Date Taking? Authorizing Provider  hydrochlorothiazide (HYDRODIURIL) 25 MG tablet Take 1 tablet  (25 mg total) by mouth daily. 09/10/16   Audry Pili, PA-C  metFORMIN (GLUCOPHAGE) 500 MG tablet Take 1 tablet (500 mg total) by mouth daily with breakfast. 09/10/16   Audry Pili, PA-C    Family History No family history on file.  Social History Social History   Tobacco Use  . Smoking status: Current Every Day Smoker    Types: Pipe  . Smokeless tobacco: Never Used  Substance Use Topics  . Alcohol use: Yes  . Drug use: No     Allergies   Patient has no known allergies.   Review of Systems Review of Systems  All other systems reviewed and are negative.    Physical Exam Updated Vital Signs BP (!) 147/106 (BP Location: Right Arm)   Pulse (!) 108   Temp 98 F (36.7 C) (Oral)   Resp 20   Ht 6' (1.829 m)   Wt 99.8 kg   SpO2 97%   BMI 29.84 kg/m   Physical Exam  Constitutional: He appears well-developed and well-nourished. No distress.  Patient appears older than stated age, appears uncomfortable.  HENT:  Head: Atraumatic.  Eyes: Conjunctivae are normal.  Neck: Neck supple.  Cardiovascular:  Tachycardia without murmur rubs or gallop  Pulmonary/Chest: Effort normal and breath sounds normal.  Abdominal: Soft. He exhibits no distension. There is no tenderness.  Musculoskeletal: He exhibits tenderness (Right lower extremity: A deep 4 cm ulcer noted  to the bottom of the foot with purulent discharge and surrounding skin erythema and edema extending to the dorsum of the foot.  It is tender to palpation.  Dorsalis pedis pulse palpable.).  Neurological: He is alert.  Skin: No rash noted.  Psychiatric: He has a normal mood and affect.  Nursing note and vitals reviewed.    ED Treatments / Results  Labs (all labs ordered are listed, but only abnormal results are displayed) Labs Reviewed  COMPREHENSIVE METABOLIC PANEL - Abnormal; Notable for the following components:      Result Value   Sodium 130 (*)    Chloride 92 (*)    Glucose, Bld 308 (*)    Calcium 8.6 (*)     Albumin 2.9 (*)    Total Bilirubin 1.4 (*)    All other components within normal limits  CBC - Abnormal; Notable for the following components:   WBC 27.7 (*)    RDW 10.7 (*)    All other components within normal limits  URINALYSIS, ROUTINE W REFLEX MICROSCOPIC - Abnormal; Notable for the following components:   Color, Urine AMBER (*)    Glucose, UA >=500 (*)    Ketones, ur 80 (*)    Protein, ur 30 (*)    All other components within normal limits  CBG MONITORING, ED - Abnormal; Notable for the following components:   Glucose-Capillary 284 (*)    All other components within normal limits  I-STAT CG4 LACTIC ACID, ED - Abnormal; Notable for the following components:   Lactic Acid, Venous 1.99 (*)    All other components within normal limits  CULTURE, BLOOD (ROUTINE X 2)  CULTURE, BLOOD (ROUTINE X 2)  LIPASE, BLOOD    EKG None  Radiology Dg Foot Complete Right  Result Date: 10/23/2017 CLINICAL DATA:  Acute right foot pain. EXAM: RIGHT FOOT COMPLETE - 3+ VIEW COMPARISON:  None. FINDINGS: There is no evidence of fracture or dislocation. There is no evidence of arthropathy or other focal bone abnormality. Soft tissues are unremarkable. IMPRESSION: Normal right foot. Electronically Signed   By: Lupita RaiderJames  Green Jr, M.D.   On: 10/23/2017 11:56    Procedures .Critical Care Performed by: Fayrene Helperran, Eliud Polo, PA-C Authorized by: Fayrene Helperran, Jennyfer Nickolson, PA-C   Critical care provider statement:    Critical care time (minutes):  45   Critical care was time spent personally by me on the following activities:  Discussions with consultants, evaluation of patient's response to treatment, examination of patient, ordering and performing treatments and interventions, ordering and review of laboratory studies, ordering and review of radiographic studies, pulse oximetry, re-evaluation of patient's condition, obtaining history from patient or surrogate and review of old charts   (including critical care  time)  Medications Ordered in ED Medications  clindamycin (CLEOCIN) IVPB 600 mg (600 mg Intravenous IV Pump Association 10/23/17 1123)  ciprofloxacin (CIPRO) IVPB 400 mg (400 mg Intravenous IV Pump Association 10/23/17 1125)  sodium chloride 0.9 % bolus 1,000 mL (0 mLs Intravenous Stopped 10/23/17 1216)    Followed by  sodium chloride 0.9 % bolus 1,000 mL (1,000 mLs Intravenous New Bag/Given 10/23/17 1216)    Followed by  sodium chloride 0.9 % bolus 1,000 mL (1,000 mLs Intravenous New Bag/Given 10/23/17 1130)    Followed by  0.9 %  sodium chloride infusion (1,000 mLs Intravenous New Bag/Given 10/23/17 1219)  ondansetron (ZOFRAN) injection 4 mg (4 mg Intravenous Given 10/23/17 1127)  ketorolac (TORADOL) 30 MG/ML injection 30 mg (30 mg Intravenous Given 10/23/17 1128)  Initial Impression / Assessment and Plan / ED Course  I have reviewed the triage vital signs and the nursing notes.  Pertinent labs & imaging results that were available during my care of the patient were reviewed by me and considered in my medical decision making (see chart for details).     BP (!) 169/99 (BP Location: Left Arm)   Pulse 98   Temp (!) 100.4 F (38 C) (Rectal)   Resp 16   Ht 6' (1.829 m)   Wt 99.8 kg   SpO2 98%   BMI 29.84 kg/m    Final Clinical Impressions(s) / ED Diagnoses   Final diagnoses:  Sepsis due to skin infection (HCC)  Type 2 diabetes mellitus with right diabetic foot ulcer [facility])    ED Discharge Orders    None     11:04 AM Patient has a diabetic foot ulcer involving his right foot with surrounding skin erythema concerning for cellulitis as well.  His infection will need to be thorough manage as it can potentially worsen and may be need to foot amputation if left untreated.  He is tachycardic with a heart rate of 108.  Is warm to the touch, but has normal oral attempt.  Will check rectal temp.  X-ray of right foot ordered to assess for potential osteomyelitis.  He meets SIRS criteria.   Will initiate IV fluid, antibiotic including clindamycin and ciprofloxacin via IV, non-opiate pain medication and antinausea medication.  Care discussed with Dr. Jeraldine Loots.  11:11 AM Rectal temperature is 100.4.  Elevated white count of 27.7.  Code sepsis initiated.  We will give IV fluid resuscitation at 30 mL/kg.  12:58 PM Appreciate consultation from internal medicine attending who agrees to see and admit patient for further management of his sepsis secondary to diabetic R foot ulcer.   Fayrene Helper, PA-C 10/23/17 1300    Fayrene Helper, PA-C 10/23/17 1300    Gerhard Munch, MD 10/23/17 [phone]

## 2017-10-23 NOTE — ED Triage Notes (Signed)
Pt. Stated, I have a hole or abscess on the bottom of my rt. foot

## 2017-10-24 ENCOUNTER — Encounter (HOSPITAL_COMMUNITY)
Admission: EM | Disposition: A | Discharge status: Home / Self Care | Payer: Self-pay | Source: Home / Self Care | Attending: Internal Medicine

## 2017-10-24 ENCOUNTER — Encounter (HOSPITAL_COMMUNITY): Payer: Self-pay | Admitting: Surgery

## 2017-10-24 ENCOUNTER — Inpatient Hospital Stay (HOSPITAL_COMMUNITY): Payer: Self-pay | Admitting: Certified Registered"

## 2017-10-24 DIAGNOSIS — L02611 Cutaneous abscess of right foot: Secondary | ICD-10-CM

## 2017-10-24 HISTORY — PX: I & D EXTREMITY: SHX5045

## 2017-10-24 LAB — CBC
HCT: 35.8 % — ABNORMAL LOW (ref 39.0–52.0)
HEMOGLOBIN: 11.8 g/dL — AB (ref 13.0–17.0)
MCH: 30 pg (ref 26.0–34.0)
MCHC: 33 g/dL (ref 30.0–36.0)
MCV: 91.1 fL (ref 78.0–100.0)
PLATELETS: 323 10*3/uL (ref 150–400)
RBC: 3.93 MIL/uL — ABNORMAL LOW (ref 4.22–5.81)
RDW: 11 % — AB (ref 11.5–15.5)
WBC: 23.1 10*3/uL — ABNORMAL HIGH (ref 4.0–10.5)

## 2017-10-24 LAB — BASIC METABOLIC PANEL
Anion gap: 10 (ref 5–15)
BUN: 9 mg/dL (ref 6–20)
CALCIUM: 7.8 mg/dL — AB (ref 8.9–10.3)
CO2: 22 mmol/L (ref 22–32)
CREATININE: 0.83 mg/dL (ref 0.61–1.24)
Chloride: 101 mmol/L (ref 98–111)
GFR calc Af Amer: >60 mL/min (ref >60)
GLUCOSE: 237 mg/dL — AB (ref 70–99)
Potassium: 3.6 mmol/L (ref 3.5–5.1)
Sodium: 133 mmol/L — ABNORMAL LOW (ref 135–145)

## 2017-10-24 LAB — GLUCOSE, CAPILLARY
GLUCOSE-CAPILLARY: 237 mg/dL — AB (ref 70–99)
GLUCOSE-CAPILLARY: 220 mg/dL — AB (ref 70–99)
GLUCOSE-CAPILLARY: 178 mg/dL — AB (ref 70–99)
GLUCOSE-CAPILLARY: 214 mg/dL — AB (ref 70–99)
Glucose-Capillary: 205 mg/dL — ABNORMAL HIGH (ref 70–99)

## 2017-10-24 LAB — HIV ANTIBODY (ROUTINE TESTING W REFLEX): HIV SCREEN 4TH GENERATION: NONREACTIVE

## 2017-10-24 SURGERY — IRRIGATION AND DEBRIDEMENT EXTREMITY
Anesthesia: General | Site: Foot | Laterality: Right

## 2017-10-24 MED ORDER — POLYETHYLENE GLYCOL 3350 17 G PO PACK: 17.0000 g | PACK | Freq: Every day | ORAL | Status: DC | PRN

## 2017-10-24 MED ORDER — BISACODYL 10 MG RE SUPP: 10.0000 mg | Freq: Every day | RECTAL | Status: DC | PRN

## 2017-10-24 MED ORDER — PROPOFOL 10 MG/ML IV BOLUS
INTRAVENOUS | Status: DC | PRN
  Administered 2017-10-24: 130 mg via INTRAVENOUS

## 2017-10-24 MED ORDER — MEPERIDINE HCL 50 MG/ML IJ SOLN: 6.2500 mg | INTRAMUSCULAR | Status: DC | PRN

## 2017-10-24 MED ORDER — SODIUM CHLORIDE 0.9 % IV SOLN
INTRAVENOUS | Status: DC
  Administered 2017-10-24: 12:00:00 via INTRAVENOUS

## 2017-10-24 MED ORDER — LACTATED RINGERS IV SOLN
INTRAVENOUS | Status: DC | PRN
  Administered 2017-10-24: 09:00:00 via INTRAVENOUS

## 2017-10-24 MED ORDER — ONDANSETRON HCL 4 MG/2ML IJ SOLN
4.0000 mg | Freq: Four times a day (QID) | INTRAMUSCULAR | Status: DC | PRN
  Administered 2017-10-25 – 2017-10-26 (×2): 4 mg via INTRAVENOUS
  Filled 2017-10-24 (×3): qty 2

## 2017-10-24 MED ORDER — MIDAZOLAM HCL 5 MG/5ML IJ SOLN
INTRAMUSCULAR | Status: DC | PRN
  Administered 2017-10-24: 2 mg via INTRAVENOUS

## 2017-10-24 MED ORDER — MAGNESIUM CITRATE PO SOLN: 1.0000 | Freq: Once | ORAL | Status: DC | PRN

## 2017-10-24 MED ORDER — METOCLOPRAMIDE HCL 5 MG PO TABS: 5.0000 mg | ORAL_TABLET | Freq: Three times a day (TID) | ORAL | Status: DC | PRN

## 2017-10-24 MED ORDER — HYDROMORPHONE HCL 1 MG/ML IJ SOLN: 0.5000 mg | INTRAMUSCULAR | Status: DC | PRN

## 2017-10-24 MED ORDER — CEFAZOLIN SODIUM-DEXTROSE 2-4 GM/100ML-% IV SOLN
2.0000 g | INTRAVENOUS | Status: AC
  Administered 2017-10-24: 2 g via INTRAVENOUS
  Filled 2017-10-24: qty 100

## 2017-10-24 MED ORDER — MIDAZOLAM HCL 2 MG/2ML IJ SOLN
INTRAMUSCULAR | Status: AC
  Filled 2017-10-24: qty 2

## 2017-10-24 MED ORDER — DOCUSATE SODIUM 100 MG PO CAPS
100.0000 mg | ORAL_CAPSULE | Freq: Two times a day (BID) | ORAL | Status: DC
  Administered 2017-10-24 – 2017-10-27 (×5): 100 mg via ORAL
  Filled 2017-10-24 (×6): qty 1

## 2017-10-24 MED ORDER — LIDOCAINE 2% (20 MG/ML) 5 ML SYRINGE
INTRAMUSCULAR | Status: DC | PRN
  Administered 2017-10-24: 100 mg via INTRAVENOUS

## 2017-10-24 MED ORDER — OXYCODONE HCL 5 MG PO TABS: 5.0000 mg | ORAL_TABLET | ORAL | Status: DC | PRN

## 2017-10-24 MED ORDER — ONDANSETRON HCL 4 MG/2ML IJ SOLN: 4.0000 mg | Freq: Once | INTRAMUSCULAR | Status: DC | PRN

## 2017-10-24 MED ORDER — SUCCINYLCHOLINE CHLORIDE 200 MG/10ML IV SOSY
PREFILLED_SYRINGE | INTRAVENOUS | Status: AC
  Filled 2017-10-24: qty 10

## 2017-10-24 MED ORDER — VANCOMYCIN HCL IN DEXTROSE 1-5 GM/200ML-% IV SOLN
1000.0000 mg | Freq: Three times a day (TID) | INTRAVENOUS | Status: DC
  Administered 2017-10-24 – 2017-10-28 (×11): 1000 mg via INTRAVENOUS
  Filled 2017-10-24 (×14): qty 200

## 2017-10-24 MED ORDER — GABAPENTIN 300 MG PO CAPS
300.0000 mg | ORAL_CAPSULE | Freq: Three times a day (TID) | ORAL | Status: DC
  Administered 2017-10-24 – 2017-10-28 (×12): 300 mg via ORAL
  Filled 2017-10-24 (×12): qty 1

## 2017-10-24 MED ORDER — ROCURONIUM BROMIDE 50 MG/5ML IV SOSY
PREFILLED_SYRINGE | INTRAVENOUS | Status: AC
  Filled 2017-10-24: qty 5

## 2017-10-24 MED ORDER — 0.9 % SODIUM CHLORIDE (POUR BTL) OPTIME
TOPICAL | Status: DC | PRN
  Administered 2017-10-24: 1000 mL

## 2017-10-24 MED ORDER — METHOCARBAMOL 1000 MG/10ML IJ SOLN
500.0000 mg | Freq: Four times a day (QID) | INTRAVENOUS | Status: DC | PRN
  Filled 2017-10-24: qty 5

## 2017-10-24 MED ORDER — OXYCODONE HCL 5 MG PO TABS: 10.0000 mg | ORAL_TABLET | ORAL | Status: DC | PRN

## 2017-10-24 MED ORDER — LIDOCAINE 2% (20 MG/ML) 5 ML SYRINGE
INTRAMUSCULAR | Status: AC
  Filled 2017-10-24: qty 15

## 2017-10-24 MED ORDER — SODIUM CHLORIDE 0.9 % IV SOLN
2.0000 g | Freq: Two times a day (BID) | INTRAVENOUS | Status: DC
  Filled 2017-10-24: qty 2

## 2017-10-24 MED ORDER — ONDANSETRON HCL 4 MG/2ML IJ SOLN
INTRAMUSCULAR | Status: DC | PRN
  Administered 2017-10-24: 4 mg via INTRAVENOUS

## 2017-10-24 MED ORDER — METHOCARBAMOL 500 MG PO TABS: 500.0000 mg | ORAL_TABLET | Freq: Four times a day (QID) | ORAL | Status: DC | PRN

## 2017-10-24 MED ORDER — VANCOMYCIN HCL IN DEXTROSE 1-5 GM/200ML-% IV SOLN
1000.0000 mg | INTRAVENOUS | Status: AC
  Administered 2017-10-24: 1000 mg via INTRAVENOUS
  Filled 2017-10-24: qty 200

## 2017-10-24 MED ORDER — PHENYLEPHRINE 40 MCG/ML (10ML) SYRINGE FOR IV PUSH (FOR BLOOD PRESSURE SUPPORT)
PREFILLED_SYRINGE | INTRAVENOUS | Status: AC
  Filled 2017-10-24: qty 10

## 2017-10-24 MED ORDER — METOCLOPRAMIDE HCL 5 MG/ML IJ SOLN
5.0000 mg | Freq: Three times a day (TID) | INTRAMUSCULAR | Status: DC | PRN
  Administered 2017-10-24 – 2017-10-26 (×3): 10 mg via INTRAVENOUS
  Filled 2017-10-24 (×3): qty 2

## 2017-10-24 MED ORDER — ONDANSETRON HCL 4 MG PO TABS
4.0000 mg | ORAL_TABLET | Freq: Four times a day (QID) | ORAL | Status: DC | PRN
  Administered 2017-10-26: 4 mg via ORAL
  Filled 2017-10-24: qty 1

## 2017-10-24 MED ORDER — ACETAMINOPHEN 325 MG PO TABS
325.0000 mg | ORAL_TABLET | Freq: Four times a day (QID) | ORAL | Status: DC | PRN
  Administered 2017-10-24: 650 mg via ORAL
  Filled 2017-10-24: qty 2

## 2017-10-24 MED ORDER — FENTANYL CITRATE (PF) 250 MCG/5ML IJ SOLN
INTRAMUSCULAR | Status: DC | PRN
  Administered 2017-10-24: 100 ug via INTRAVENOUS

## 2017-10-24 MED ORDER — HYDROMORPHONE HCL 1 MG/ML IJ SOLN
INTRAMUSCULAR | Status: AC
  Filled 2017-10-24: qty 1

## 2017-10-24 MED ORDER — FENTANYL CITRATE (PF) 250 MCG/5ML IJ SOLN
INTRAMUSCULAR | Status: AC
  Filled 2017-10-24: qty 5

## 2017-10-24 MED ORDER — HYDROMORPHONE HCL 1 MG/ML IJ SOLN
0.2500 mg | INTRAMUSCULAR | Status: DC | PRN
  Administered 2017-10-24: 0.25 mg via INTRAVENOUS

## 2017-10-24 MED ORDER — ALBUTEROL SULFATE HFA 108 (90 BASE) MCG/ACT IN AERS
INHALATION_SPRAY | RESPIRATORY_TRACT | Status: AC
  Filled 2017-10-24: qty 6.7

## 2017-10-24 MED ORDER — SODIUM CHLORIDE 0.9 % IV SOLN
1.0000 g | INTRAVENOUS | Status: DC
  Administered 2017-10-24 – 2017-10-28 (×4): 1 g via INTRAVENOUS
  Filled 2017-10-24 (×5): qty 10

## 2017-10-24 MED ORDER — ACETAMINOPHEN 325 MG PO TABS: 325.0000 mg | ORAL_TABLET | Freq: Four times a day (QID) | ORAL | Status: DC | PRN

## 2017-10-24 MED ORDER — DEXAMETHASONE SODIUM PHOSPHATE 10 MG/ML IJ SOLN
INTRAMUSCULAR | Status: AC
  Filled 2017-10-24: qty 1

## 2017-10-24 MED ORDER — ONDANSETRON HCL 4 MG/2ML IJ SOLN
INTRAMUSCULAR | Status: AC
  Filled 2017-10-24: qty 6

## 2017-10-24 MED ORDER — METRONIDAZOLE IN NACL 5-0.79 MG/ML-% IV SOLN
500.0000 mg | Freq: Three times a day (TID) | INTRAVENOUS | Status: DC
  Administered 2017-10-24 – 2017-10-28 (×12): 500 mg via INTRAVENOUS
  Filled 2017-10-24 (×14): qty 100

## 2017-10-24 SURGICAL SUPPLY — 26 items
BLADE SURG 21 STRL SS (BLADE) ×3 IMPLANT
COVER SURGICAL LIGHT HANDLE (MISCELLANEOUS) ×4 IMPLANT
DRAPE U-SHAPE 47X51 STRL (DRAPES) ×3 IMPLANT
DRESSING PREVENA PLUS CUSTOM (GAUZE/BANDAGES/DRESSINGS) IMPLANT
DRSG PREVENA PLUS CUSTOM (GAUZE/BANDAGES/DRESSINGS) ×3
DURAPREP 26ML APPLICATOR (WOUND CARE) ×3 IMPLANT
ELECT REM PT RETURN 9FT ADLT (ELECTROSURGICAL) ×3
ELECTRODE REM PT RTRN 9FT ADLT (ELECTROSURGICAL) IMPLANT
GLOVE BIOGEL PI IND STRL 9 (GLOVE) ×1 IMPLANT
GLOVE BIOGEL PI INDICATOR 9 (GLOVE) ×2
GLOVE SURG ORTHO 9.0 STRL STRW (GLOVE) ×3 IMPLANT
GOWN STRL REUS W/ TWL XL LVL3 (GOWN DISPOSABLE) ×2 IMPLANT
GOWN STRL REUS W/TWL XL LVL3 (GOWN DISPOSABLE) ×6
HANDPIECE INTERPULSE COAX TIP (DISPOSABLE)
KIT BASIN OR (CUSTOM PROCEDURE TRAY) ×3 IMPLANT
KIT DRSG PREVENA PLUS 7DAY 125 (MISCELLANEOUS) ×2 IMPLANT
KIT TURNOVER KIT B (KITS) ×3 IMPLANT
MANIFOLD NEPTUNE II (INSTRUMENTS) ×3 IMPLANT
NS IRRIG 1000ML POUR BTL (IV SOLUTION) ×3 IMPLANT
PACK ORTHO EXTREMITY (CUSTOM PROCEDURE TRAY) ×3 IMPLANT
PAD ARMBOARD 7.5X6 YLW CONV (MISCELLANEOUS) ×4 IMPLANT
SET HNDPC FAN SPRY TIP SCT (DISPOSABLE) IMPLANT
TOWEL OR 17X26 10 PK STRL BLUE (TOWEL DISPOSABLE) ×3 IMPLANT
TUBE CONNECTING 12'X1/4 (SUCTIONS) ×1
TUBE CONNECTING 12X1/4 (SUCTIONS) ×2 IMPLANT
YANKAUER SUCT BULB TIP NO VENT (SUCTIONS) ×3 IMPLANT

## 2017-10-24 NOTE — Op Note (Signed)
10/24/2017  9:54 AM  PATIENT:  Jason Monroe    PRE-OPERATIVE DIAGNOSIS:  abscess of right foot  POST-OPERATIVE DIAGNOSIS:  Same  PROCEDURE:  IRRIGATION AND DEBRIDEMENT OF FOOT, with debridement of multiple compartments excision of the muscle tendon fascia and skin. Application of cleanse choice wound VAC dressing  SURGEON:  Nadara MustardMarcus V Jackie Russman, MD  PHYSICIAN ASSISTANT:None ANESTHESIA:   General  PREOPERATIVE INDICATIONS:  Jason Monroe is a  47 y.o. male with a diagnosis of abscess of right foot who failed conservative measures and elected for surgical management.    The risks benefits and alternatives were discussed with the patient preoperatively including but not limited to the risks of infection, bleeding, nerve injury, cardiopulmonary complications, the need for revision surgery, among others, and the patient was willing to proceed.  OPERATIVE IMPLANTS: Cleanse choice wound VAC dressing to sponges  @ENCIMAGES @  OPERATIVE FINDINGS: Superficial and deep abscess.  Cultures obtained x3.  OPERATIVE PROCEDURE: Patient was brought the operating room and underwent a general anesthetic.  After adequate levels of anesthesia were obtained patient's right lower extremity was prepped using DuraPrep draped in the sterile field a timeout was called.  The ulcerative tissue was ellipsed out and this left a wound that was 13 x 5 cm.  Fascia muscle and exposed tendon was sharply excised.  Deep compartments were then explored and patient had abscess that extended into the deep compartments.  This was irrigated with normal saline involved tissue was excised with a rondure work.  Further irrigation was performed.  The wound was packed open with a cleanse choice wound VAC dressing.  This had a good suction fit patient was extubated taken to PACU in stable condition.   DISCHARGE PLANNING:  Antibiotic duration: Continue IV antibiotics until cultures are finalized.  Weightbearing: Nonweightbearing on the  right.  Pain medication: Opioid pathway ordered  Dressing care/ Wound VAC: Continue incisional wound VAC  Ambulatory devices: Walker  Discharge to: Anticipate patient will be able to be discharged to home.  We will plan for return to the operating room on Wednesday for further debridement possible application of split-thickness skin graft.  Follow-up: In the office 1 week post operative.

## 2017-10-24 NOTE — Progress Notes (Signed)
ANTIBIOTIC CONSULT NOTE - INITIAL  Pharmacy Consult for  Vanco/Cefepime Indication: diabetic foot wound  Allergies  Allergen Reactions  . Metformin And Related Hives    Patient Measurements: Height: 6' (182.9 cm) Weight: 220 lb (99.8 kg) IBW/kg (Calculated) : 77.6 Adjusted Body Weight:    Vital Signs: Temp: 100.5 F (38.1 C) (08/25 0539) Temp Source: Oral (08/25 0539) BP: 148/88 (08/25 0539) Pulse Rate: 98 (08/25 0539) Intake/Output from previous day: 08/24 0701 - 08/25 0700 In: 1428.3 [P.O.:240; IV Piggyback:1188.3] Out: 500 [Urine:500] Intake/Output from this shift: No intake/output data recorded.  Labs: Recent Labs    10/23/17 1015 10/24/17 0522  WBC 27.7* 23.1*  HGB 14.4 11.8*  PLT 366 323  CREATININE 0.96 0.83   Estimated Creatinine Clearance: 137.5 mL/min (by C-G formula based on SCr of 0.83 mg/dL). No results for input(s): VANCOTROUGH, VANCOPEAK, VANCORANDOM, GENTTROUGH, GENTPEAK, GENTRANDOM, TOBRATROUGH, TOBRAPEAK, TOBRARND, AMIKACINPEAK, AMIKACINTROU, AMIKACIN in the last 72 hours.   Microbiology: Recent Results (from the past 720 hour(s))  MRSA PCR Screening     Status: None   Collection Time: 10/23/17  6:14 PM  Result Value Ref Range Status   MRSA by PCR NEGATIVE NEGATIVE Final    Comment:        The GeneXpert MRSA Assay (FDA approved for NASAL specimens only), is one component of a comprehensive MRSA colonization surveillance program. It is not intended to diagnose MRSA infection nor to guide or monitor treatment for MRSA infections. Performed at [facility], [address] [place], [place] [address]     Medical History: Past Medical History:  Diagnosis Date  . Diabetes mellitus without complication (HCC)    Assessment: CC/HPI: foot infection, R foot ulcer with increasing pain, redness, and swelling  PMH: diabetic neuropathy, DM (stopped meds)  ID: Suspected diabetic foot infection/abscess with I&D 8/25.. WBC 27.7. LA  1.99 - Tmax 103>100.5 currently. WBC 23.1 down. Scr WNL  Cefepime 8/24>> Flagyl 8/24>> Vanco 8/25>>  8/24: MRSA PCR: negative 8/24 BC x 2>>   Goal of Therapy:  Vancomycin trough level 10-15 mcg/ml  Plan:  Cefepime 2g IV q 12 hrs Flagyl IV q 8 hrs Vancomycin 1g IV q 8 hrs. Trough at steady state  Thayden Lemire S. Merilynn Finlandobertson, PharmD, BCPS Clinical Staff Pharmacist Pager [phone]  Misty Stanleyobertson, Ophia Shamoon Stillinger 10/24/2017,9:43 AM

## 2017-10-24 NOTE — Progress Notes (Addendum)
Pt is A&O x4, right foot dressing dry and intact. Denies pain at this time. NPO post mn maint.   0815 Pt to short stay. [facility] Family present.  1430 Received pt back from PACU, A&O x3. Right foot with Prevena wound vac dressing dry and intact. Denies pain at this time. 1625 temp 101.6, Dr Petra KubaVogel notified, Tylenol was given.

## 2017-10-24 NOTE — Anesthesia Postprocedure Evaluation (Signed)
Anesthesia Post Note  Patient: Jason Monroe  Procedure(s) Performed: IRRIGATION AND DEBRIDEMENT OF FOOT (Right Foot)     Patient location during evaluation: PACU Anesthesia Type: General Level of consciousness: awake and alert Pain management: pain level controlled Vital Signs Assessment: post-procedure vital signs reviewed and stable Respiratory status: spontaneous breathing, nonlabored ventilation, respiratory function stable and patient connected to nasal cannula oxygen Cardiovascular status: blood pressure returned to baseline and stable Postop Assessment: no apparent nausea or vomiting Anesthetic complications: no    Last Vitals:  Vitals:   10/24/17 0955 10/24/17 1010  BP: 126/78 123/76  Pulse: 88 80  Resp: 17 18  Temp:    SpO2: 94% 95%    Last Pain:  Vitals:   10/24/17 0955  TempSrc:   PainSc: 0-No pain                 Lillian Tigges DAVID

## 2017-10-24 NOTE — Transfer of Care (Signed)
Immediate Anesthesia Transfer of Care Note  Patient: Jason Monroe  Procedure(s) Performed: IRRIGATION AND DEBRIDEMENT OF FOOT (Right Foot)  Patient Location: PACU  Anesthesia Type:General  Level of Consciousness: awake, alert , oriented and patient cooperative  Airway & Oxygen Therapy: Patient Spontanous Breathing and Patient connected to nasal cannula oxygen  Post-op Assessment: Report given to RN, Post -op Vital signs reviewed and stable and Patient moving all extremities  Post vital signs: Reviewed and stable  Last Vitals:  Vitals Value Taken Time  BP 126/78 10/24/2017  9:55 AM  Temp    Pulse 88 10/24/2017  9:56 AM  Resp 19 10/24/2017  9:56 AM  SpO2 93 % 10/24/2017  9:56 AM  Vitals shown include unvalidated device data.  Last Pain:  Vitals:   10/24/17 0539  TempSrc: Oral  PainSc:          Complications: No apparent anesthesia complications

## 2017-10-24 NOTE — Progress Notes (Signed)
Orthopedic Tech Progress Note Patient Details:  Jason Monroe 02/13/1971 [id number]  Ortho Devices Type of Ortho Device: Postop shoe/boot Ortho Device/Splint Interventions: Application   Post Interventions Instructions Provided: Care of device   Saul FordyceJennifer C Joas Motton 10/24/2017, 6:14 PM

## 2017-10-24 NOTE — Progress Notes (Addendum)
Subjective:  Jason Monroe is a 47 yo M w/ PMH of T2DM not on insulin presenting with 2 wk infected right foot plantar ulcer on hospital day 1  Jason Monroe was examined and evaluated at bedside AM. He states he feels dehydrated and have been having intermittent fever with sweats overnight. Tylenol appears to be helping but still feels uncomfortable on NPO. States overnight he had some purulent drainage from the bandage around his foot ulcer. Was able to tolerate dinner last night without nausea, vomiting. He understands he will be going to to OR today for I&D. States he looks forward to it. Denies any N/V/D/C  Objective:  Vital signs in last 24 hours: Vitals:   10/23/17 1110 10/23/17 1446 10/23/17 2038 10/24/17 0539  BP: (!) 169/99 (!) 146/92 (!) 150/89 (!) 148/88  Pulse: 98 91 100 98  Resp: 16 16 16 16   Temp: (!) 100.4 F (38 C) 99.9 F (37.7 C) (!) 103 F (39.4 C) (!) 100.5 F (38.1 C)  TempSrc: Rectal Oral Oral Oral  SpO2: 98% 96% 94% 93%  Weight:      Height:       Physical Exam  Constitutional: He is well-developed, well-nourished, and in no distress. No distress.  HENT:  Head: Normocephalic and atraumatic.  Mouth/Throat: Oropharynx is clear and moist. No oropharyngeal exudate.  Eyes: Pupils are equal, round, and reactive to light. Conjunctivae and EOM are normal. No scleral icterus.  Right eye ptosis  Neck: Normal range of motion. Neck supple. No JVD present. No tracheal deviation present. No thyromegaly present.  Cardiovascular: Normal rate, regular rhythm, normal heart sounds and intact distal pulses.  Pulmonary/Chest: Effort normal and breath sounds normal.  Abdominal: Soft. Bowel sounds are normal.  Musculoskeletal: Normal range of motion. He exhibits edema and tenderness.  Swollen right foot covered with bandaging. Did not remove to inspect. Refer to H&P for picture on initial presentation  Skin: He is diaphoretic.   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 He appears to have tried to self-manage with diabetes unsuccessfully and developed lower extremity numbness leading to plantar foot ulcer that became infected before he realized the severity. Now presenting with systemic signs of illness, with fever and nausea. Currently on systemic antibiotics and sliding scale insulin. Going to OR today for I&D. We will try to optimize his diabetic regimen before discharge and set him up with a PCP.  Infected Right foot ulcer complicated by diabetes Presenting with infected right foot ulcer complicated by uncontrolled diabetes with leukocytosis and systemic symptoms. Right foot X-ray does not show signs of osteomyelitis but will defer to ortho for recommendations. Wbc 23.1 <- 27.7 Lactate 1.2 <- 1.99 - OR today for I&D - Appreciate ortho recs - STOP Metronidazole 500mg  q8hr, Cefepime 2g q12hr - START Ceftriaxone 1g q24 - START Vancomycin per pharm - C/w Zofran 4mg  q6h PRN for nausea - C/w Hydrocodone-acetaminophen q4hr for pain - Wound care consult  Uncontrolled T2DM Allergic to metformin and glipizide. Managed with diet and exercise. Random glucose on admission 308. Hgb A1c measured today at 10.7. Fasting glucose today 220. Received 12 units aspart + 15 units since admission yesterday afternoon - sliding scale insulin aspart - C/w  Lantus 15 units today - Will reassess when active infection is resolved  DVT proph: Lovenox 40mg  Diet: Diabetic diet Code: FULL  Dispo: Anticipated discharge in approximately 4 day(s).   Theotis Barrio, MD 10/24/2017, 9:31 AM Pager: [phone]

## 2017-10-24 NOTE — Consult Note (Signed)
WOC Nurse wound consult note Reason for Consult: Right foot wound Wound type:Infectious, chronic, nonhealing WOC Nursing was simultaneously consulted with orthopedics (Dr. Lajoyce Cornersuda) to whom the patient is known.  Taken to OR today (Sunday, 8/25) for debridement and placement of NPWT.  Plan includes returning to OR on Wednesday, 8/28 for reassessment and possible STSG.  WOC nursing team will not follow, but will remain available to this patient, the nursing and medical teams.  Please re-consult if needed. Thanks, Ladona MowLaurie Basil Blakesley, MSN, RN, GNP, Hans EdenCWOCN, CWON-AP, FAAN  Pager# [phone]

## 2017-10-24 NOTE — Anesthesia Procedure Notes (Signed)
Procedure Name: LMA Insertion Date/Time: 10/24/2017 9:16 AM Performed by: Lucinda Dellecarlo, Zarion Oliff M, CRNA Pre-anesthesia Checklist: Patient identified, Emergency Drugs available, Suction available and Patient being monitored Patient Re-evaluated:Patient Re-evaluated prior to induction Oxygen Delivery Method: Circle system utilized Preoxygenation: Pre-oxygenation with 100% oxygen Induction Type: IV induction Ventilation: Mask ventilation without difficulty LMA: LMA inserted LMA Size: 4.0 Tube type: Oral Number of attempts: 1 Placement Confirmation: positive ETCO2 and breath sounds checked- equal and bilateral Tube secured with: Tape Dental Injury: Teeth and Oropharynx as per pre-operative assessment

## 2017-10-24 NOTE — Interval H&P Note (Signed)
History and Physical Interval Note:  10/24/2017 7:30 AM  Jason Monroe  has presented today for surgery, with the diagnosis of abscess of right foot  The various methods of treatment have been discussed with the patient and family. After consideration of risks, benefits and other options for treatment, the patient has consented to  Procedure(s): IRRIGATION AND DEBRIDEMENT OF FOOT (Right) as a surgical intervention .  The patient's history has been reviewed, patient examined, no change in status, stable for surgery.  I have reviewed the patient's chart and labs.  Questions were answered to the patient's satisfaction.     Nadara MustardMarcus V Taleeyah Bora

## 2017-10-24 NOTE — Anesthesia Preprocedure Evaluation (Signed)
Anesthesia Evaluation  Patient identified by MRN, date of birth, ID band Patient awake    Reviewed: Allergy & Precautions, NPO status , Patient's Chart, lab work & pertinent test results  Airway Mallampati: I  TM Distance: >3 FB Neck ROM: Full    Dental   Pulmonary Current Smoker,    Pulmonary exam normal        Cardiovascular Normal cardiovascular exam     Neuro/Psych    GI/Hepatic   Endo/Other  diabetes, Type 2, Insulin Dependent  Renal/GU      Musculoskeletal   Abdominal   Peds  Hematology   Anesthesia Other Findings   Reproductive/Obstetrics                             Anesthesia Physical Anesthesia Plan  ASA: II  Anesthesia Plan: General   Post-op Pain Management:    Induction: Intravenous  PONV Risk Score and Plan: 1 and Ondansetron and Treatment may vary due to age or medical condition  Airway Management Planned: LMA  Additional Equipment:   Intra-op Plan:   Post-operative Plan: Extubation in OR  Informed Consent: I have reviewed the patients History and Physical, chart, labs and discussed the procedure including the risks, benefits and alternatives for the proposed anesthesia with the patient or authorized representative who has indicated his/her understanding and acceptance.     Plan Discussed with: CRNA and Surgeon  Anesthesia Plan Comments:         Anesthesia Quick Evaluation

## 2017-10-25 ENCOUNTER — Ambulatory Visit (INDEPENDENT_AMBULATORY_CARE_PROVIDER_SITE_OTHER): Payer: Self-pay | Admitting: Physician Assistant

## 2017-10-25 ENCOUNTER — Encounter (HOSPITAL_COMMUNITY): Payer: Self-pay | Admitting: Orthopedic Surgery

## 2017-10-25 DIAGNOSIS — Z978 Presence of other specified devices: Secondary | ICD-10-CM

## 2017-10-25 LAB — CBC
HCT: 35.5 % — ABNORMAL LOW (ref 39.0–52.0)
Hemoglobin: 11.9 g/dL — ABNORMAL LOW (ref 13.0–17.0)
MCH: 30.1 pg (ref 26.0–34.0)
MCHC: 33.5 g/dL (ref 30.0–36.0)
MCV: 89.6 fL (ref 78.0–100.0)
PLATELETS: 320 10*3/uL (ref 150–400)
RBC: 3.96 MIL/uL — ABNORMAL LOW (ref 4.22–5.81)
RDW: 10.9 % — AB (ref 11.5–15.5)
WBC: 25 10*3/uL — ABNORMAL HIGH (ref 4.0–10.5)

## 2017-10-25 LAB — BASIC METABOLIC PANEL
Anion gap: 12 (ref 5–15)
CALCIUM: 8.3 mg/dL — AB (ref 8.9–10.3)
CO2: 25 mmol/L (ref 22–32)
CREATININE: 0.77 mg/dL (ref 0.61–1.24)
Chloride: 98 mmol/L (ref 98–111)
GFR calc non Af Amer: >60 mL/min (ref >60)
Glucose, Bld: 220 mg/dL — ABNORMAL HIGH (ref 70–99)
Potassium: 3.3 mmol/L — ABNORMAL LOW (ref 3.5–5.1)
SODIUM: 135 mmol/L (ref 135–145)

## 2017-10-25 LAB — GLUCOSE, CAPILLARY
GLUCOSE-CAPILLARY: 214 mg/dL — AB (ref 70–99)
GLUCOSE-CAPILLARY: 169 mg/dL — AB (ref 70–99)
Glucose-Capillary: 244 mg/dL — ABNORMAL HIGH (ref 70–99)
Glucose-Capillary: 137 mg/dL — ABNORMAL HIGH (ref 70–99)

## 2017-10-25 LAB — VANCOMYCIN, TROUGH: VANCOMYCIN TR: 10 ug/mL — AB (ref 15–20)

## 2017-10-25 MED ORDER — PRO-STAT SUGAR FREE PO LIQD
30.0000 mL | Freq: Two times a day (BID) | ORAL | Status: DC
  Administered 2017-10-25 – 2017-10-28 (×2): 30 mL via ORAL
  Filled 2017-10-25 (×5): qty 30

## 2017-10-25 MED ORDER — INSULIN GLARGINE 100 UNIT/ML ~~LOC~~ SOLN
18.0000 [IU] | Freq: Every day | SUBCUTANEOUS | Status: DC
  Administered 2017-10-25 – 2017-10-26 (×2): 18 [IU] via SUBCUTANEOUS
  Filled 2017-10-25 (×2): qty 0.18

## 2017-10-25 MED ORDER — INSULIN GLARGINE 100 UNIT/ML ~~LOC~~ SOLN: 20.0000 [IU] | Freq: Every day | SUBCUTANEOUS | Status: DC

## 2017-10-25 MED ORDER — POTASSIUM CHLORIDE CRYS ER 20 MEQ PO TBCR
20.0000 meq | EXTENDED_RELEASE_TABLET | Freq: Once | ORAL | Status: AC
  Administered 2017-10-25: 20 meq via ORAL
  Filled 2017-10-25: qty 1

## 2017-10-25 NOTE — Progress Notes (Signed)
Patient ID: Jason Monroe, male   DOB: 05/12/1970, 47 y.o.   MRN: [id number] Patient is postoperative day 1 debridement of large abscess right foot.  There is approximately 50 cc of fluid in the wound VAC canister.  Plan for return to the operating room on Wednesday with repeat irrigation debridement and possible split-thickness skin graft.  Intraoperative Gram stains are positive for gram-positive cocci and gram-negative rods, MRSA PCR was negative.

## 2017-10-25 NOTE — Progress Notes (Signed)
Pharmacy Antibiotic Note  Jason Monroe is a 47 y.o. male admitted on 10/23/2017 with diabetic foot wound, on D#2 vancomycin, rocephin and flagyl. Vancomycin trough = 10, drawn ~ 9 hrs after previous dose. Pt has received 4 doses since admission. Renal function has been stable. True trough is probably ~ 13-15   Cefepime 8/24>> Flagyl 8/24>> Vanco 8/25>>  8/24: MRSA PCR: negative 8/24 BC x 2>>  Plan: Continue vancomycin 1g IV Q 8 hrs Monitor renal function, f/u LOT   Height: 6' (182.9 cm) Weight: 220 lb (99.8 kg) IBW/kg (Calculated) : 77.6  Temp (24hrs), Avg:99 F (37.2 C), Min:98.3 F (36.8 C), Max:99.9 F (37.7 C)  Recent Labs  Lab 10/23/17 1015 10/23/17 1114 10/23/17 1551 10/23/17 1856 10/24/17 0522 10/25/17 0319 10/25/17 1814  WBC 27.7*  --   --   --  23.1* 25.0*  --   CREATININE 0.96  --   --   --  0.83 0.77  --   LATICACIDVEN  --  1.99* 0.9 1.2  --   --   --   VANCOTROUGH  --   --   --   --   --   --  10*    Estimated Creatinine Clearance: 142.7 mL/min (by C-G formula based on SCr of 0.77 mg/dL).    Allergies  Allergen Reactions  . Metformin And Related Hives    Thank you for allowing pharmacy to be a part of this patient's care.  Bayard HuggerMei Anup Brigham, PharmD, BCPS, BCPPS Clinical Pharmacist  Pager: [phone]   10/25/2017 8:20 PM

## 2017-10-25 NOTE — Progress Notes (Signed)
Inpatient Diabetes Program Recommendations  AACE/ADA: New Consensus Statement on Inpatient Glycemic Control (2015)  Target Ranges:  Prepandial:   less than 140 mg/dL      Peak postprandial:   less than 180 mg/dL (1-2 hours)      Critically ill patients:  140 - 180 mg/dL   Spoke with patient and mom at bedside. Discussed A1c value again. Reviewed A1c and glucose goals. Discussed insulin at time of d/c. Showed patient vial and syringe and also insulin pen versions. Patient prefers Nurse, mental healthVial and Syringe methods of insulin administration at time of d/c.  Discussed Nutrition and lifestyle modifications. Patient and mom had many questions about diet and glucose goals.  Discussed Hypo/Hyperlgycemia s/s and treatment for both. Gave patient [employer] product price list. Discussed with patient if he follows with one of our clinics that he will get his insulin from [facility].  Thanks,  Christena DeemShannon Jamisyn Langer RN, MSN, BC-ADM Inpatient Diabetes Coordinator Team Pager [phone] (8a-5p)

## 2017-10-25 NOTE — Progress Notes (Addendum)
Subjective:  Jason Monroe is a 47 yo M w/ PMH of T2DM not on insulin presenting with 2 wk infected right foot plantar ulcer on hospital day 2.  Jason Monroe was examined and evaluated at bedside this AM with mother present. He states he feels much better this morning compared to yesterday. He states his foot pain is manageable. He has been tolerating his diet without nausea, vomiting. Passing urine without problem. Endorsing flatus but no bowel movement. Another episode of fever overnight but he feels okay. O2 sat down to low 90s on 2 L Oxygen. States he is using incentive spirometer every 15 minutes. Patient understands that he will return to OR on Wednesday for repeat I&D. Denies any Diarrhea, Constipation, Chest pain, palpitations, chills, tremors.  Objective:  Vital signs in last 24 hours: Vitals:   10/24/17 1900 10/24/17 2028 10/25/17 0003 10/25/17 0319  BP:  (!) 145/91 (!) 147/91 133/83  Pulse:  91 98 91  Resp:  18 16 16   Temp: 98.6 F (37 C) 98.5 F (36.9 C) 98.3 F (36.8 C) 99.9 F (37.7 C)  TempSrc: Oral Oral Oral Oral  SpO2:  96% 93% 93%  Weight:      Height:       Physical Exam  Constitutional: He is oriented to person, place, and time and well-developed, well-nourished, and in no distress. No distress.  HENT:  Head: Normocephalic and atraumatic.  Mouth/Throat: Oropharynx is clear and moist. No oropharyngeal exudate.  2L O2 [place] in place  Eyes: Pupils are equal, round, and reactive to light. Conjunctivae and EOM are normal. No scleral icterus.  Right side ptosis  Neck: Normal range of motion. Neck supple. No thyromegaly present.  Cardiovascular: Normal rate, regular rhythm, normal heart sounds and intact distal pulses.  Pulmonary/Chest: Effort normal and breath sounds normal. No respiratory distress. He has no wheezes. He has no rales.  Abdominal: Soft. Bowel sounds are normal. He exhibits no distension. There is no tenderness. There is no guarding.  Musculoskeletal: Normal  range of motion. He exhibits edema. He exhibits no tenderness or deformity.  Right foot plantar ulcer with wound vac in place  Neurological: He is alert and oriented to person, place, and time. GCS score is 15.  Skin: Skin is warm. No rash noted. He is diaphoretic. No erythema.   Assessment/Plan:  Principal Problem:   Diabetic ulcer of foot with fat layer exposed (HCC) Active Problems:   Sepsis due to skin infection (HCC)   Type 2 diabetes mellitus with right diabetic foot ulcer (HCC)   Moderate protein-calorie malnutrition (HCC)   Cutaneous abscess of right foot  [name] is a 47 yo M w/ PMH of T2DM presenting with right foot ulcer. He appears to have tried to self-manage with diabetes unsuccessfully and developed lower extremity numbness leading to plantar foot ulcer that became infected before he realized the severity. Now presenting with systemic signs of illness, with fever and nausea. Currently on systemic antibiotics and sliding scale insulin. I&D on 10/24/17 and planned for return to OR on 10/27/17. Continuing to have fever, leukocytosis and having increased oxygen demand after OR. Encouraging incentive spirometer use and we will adjust abx regimen depending on culture data  Infected Right foot ulcer complicated by diabetes Presenting with infected right foot ulcer complicated by uncontrolled diabetes with leukocytosis and systemic symptoms. Status post OR I&D on 10/25/17. Continuing to endorse fever and increasing oxygen demand. Gram stain show gram + cocci, gram variable rod, culture is pending. Wbc 25 <-  23.1 <- 27.7 Lactate 1.2 <- 1.99 - F/u wound culture - F/u ABI - Appreciate ortho recs - C/w incentive spirometry - C/w Metronidazole 500mg  q8hr, Ceftriaxone 1g q24, - C/w Vancomycin per pharm - C/w Zofran 4mg  q6h PRN for nausea - C/w Hydrocodone-acetaminophen q4hr for pain  Uncontrolled T2DM Allergic to metformin and glipizide. Managed with diet and exercise. Random glucose  on admission 308. Hgb A1c measured today at 10.7. Fasting glucose today 214. Received 6 units aspart + 15 units glargine in last 24 hours - sliding scale insulin aspart - Lantus 18 units today - Will reassess when active infection is resolved  DVT proph: Lovenox 40mg  Diet: Diabetic diet Code: FULL  Dispo: Anticipated discharge in approximately 4 day(s).   Theotis Barrio, MD 10/25/2017, 9:19 AM Pager: [phone]

## 2017-10-25 NOTE — Plan of Care (Signed)
Nutrition Education Note  RD consulted for nutrition education regarding diabetes.   Lab Results  Component Value Date   HGBA1C 10.7 (H) 10/23/2017    RD provided "Carbohydrate Counting for People with Diabetes" handout from the Academy of Nutrition and Dietetics. Discussed different food groups and their effects on blood sugar, emphasizing carbohydrate-containing foods. Provided list of carbohydrates and recommended serving sizes of common foods.  Discussed importance of controlled and consistent carbohydrate intake throughout the day. Provided examples of ways to balance meals/snacks and encouraged intake of high-fiber, whole grain complex carbohydrates. Teach back method used.  Expect good compliance.  Body mass index is 29.84 kg/m. Pt meets criteria for "overweight" based on current BMI.  Current diet order is Carb Modified, patient is consuming approximately 50% of meals at this time. Labs and medications reviewed. See full assessment note for details. RD contact information provided. If additional nutrition issues arise, please re-consult RD.   Earma ReadingKate Jablonski Jourdan Durbin, MS, RD, LDN Pager: [phone] Weekend/After Hours: [phone]

## 2017-10-25 NOTE — Plan of Care (Signed)

## 2017-10-25 NOTE — Progress Notes (Signed)
Initial Nutrition Assessment  DOCUMENTATION CODES:   Not applicable  INTERVENTION:   - Provided nutrition education and handout regarding type 2 diabetes (see education note for more details)  - 30 ml Pro-stat BID, each supplement provides 100 kcal and 15 grams of protein  NUTRITION DIAGNOSIS:   Increased nutrient needs related to wound healing as evidenced by estimated needs  GOAL:   Patient will meet greater than or equal to 90% of their needs  MONITOR:   PO intake, Supplement acceptance, I & O's, Skin, Labs  REASON FOR ASSESSMENT:   Consult Wound healing  ASSESSMENT:   47 year old male who presented to the ED with a foot wound. PMH significant for type 2 diabetes mellitus.  8/25 - s/p I&D of R foot, wound VAC applied  Noted plan for pt to return to the OR on Wednesday for repeat I&D and possible split-thickness skin graft.  Spoke with pt and pt's mother at bedside. Pt states that he had a good appetite PTA but is slowly gaining his appetite back during admission. Pt endorses early satiety. Pt endorses nausea upon admission but states that it is "better now."  Pt states that he has been trying to follow Atkins "for my diabetes." Pt endorses "reading a lot of books about getting over diabetes" and states that he hopes "to be off the shots in 1 year." Pt is agreeable to receiving Pro-stat oral nutrition supplements during admission to aid in wound healing.  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 Lunch may include an oatmeal cookie. Dinner may include "a decent meal." Pt denies eating breakfast.  Pt endorses intentional weight loss, stating that his UBW prior to changing his diet was 230-240 lbs. Pt states he lost 30 lbs in 1 year. Per weight history in chart, pt has lost 25 lbs over the past 13 months.  Meal Completion: 25-80%  Medications reviewed and include: 100 mg Colace BID, sliding scale Novolog, 18 units Lantus  daily, IV antibiotics  Labs reviewed: potassium 3.3 (L), hemoglobin 11.9 (L), HCT 35.5 (L), hemoglobin A1C 10.7 (H) CBG's: 214, 214, 178, 205 x 24 hours  VAC output: 40 ml overnight UOP: 1300 ml x 24 hours I/O's: +2.4 L since admission  NUTRITION - FOCUSED PHYSICAL EXAM:    Most Recent Value  Orbital Region  No depletion  Upper Arm Region  No depletion  Thoracic and Lumbar Region  No depletion  Buccal Region  No depletion  Temple Region  No depletion  Clavicle Bone Region  No depletion  Clavicle and Acromion Bone Region  No depletion  Scapular Bone Region  No depletion  Dorsal Hand  No depletion  Patellar Region  No depletion  Anterior Thigh Region  No depletion  Posterior Calf Region  No depletion  Edema (RD Assessment)  Mild [R lower extremity]  Hair  Reviewed  Eyes  Reviewed  Mouth  Reviewed  Skin  Reviewed  Nails  Reviewed       Diet Order:   Diet Order            Diet NPO time specified Except for: Sips with Meds  Diet effective midnight        Diet Carb Modified Fluid consistency: Thin; Room service appropriate? Yes  Diet effective now              EDUCATION NEEDS:   Education needs have been addressed  Skin:  Skin Assessment: Skin Integrity  Issues: Wound Vac: R foot Diabetic Ulcer: R foot  Last BM:  10/23/17  Height:   Ht Readings from Last 1 Encounters:  10/23/17 6' (1.829 m)    Weight:   Wt Readings from Last 1 Encounters:  10/23/17 99.8 kg    Ideal Body Weight:  80.91 kg  BMI:  Body mass index is 29.84 kg/m.  Estimated Nutritional Needs:   Kcal:  2300-2500  Protein:  115-130 grams  Fluid:  >/= 2.3 L    Earma ReadingKate Jablonski Elisha Cooksey, MS, RD, LDN Pager: [phone] Weekend/After Hours: [phone]

## 2017-10-25 NOTE — Progress Notes (Signed)
Physical Therapy Evaluation Patient Details Name: Jason Monroe MRN: [id number] DOB: 08-18-1970 Today's Date: 10/25/2017   History of Present Illness  Jason Monroe is a 47 y.o. male s/p irrigation and debridement of the plantar surface of R foot. Pt is NWB on RLE. PMH significant of T2DM.   Clinical Impression  Patient presents to PT s/p above procedure resulting in functional limitations due to the deficits listed below (see PT Problem List). At the time of the evaluation, pt performed ambulation with Min G +2 for chair follow, with RW in order to maintain weight bearing precautions. Pt required seated rest x3, limited by fatigue and pain at wrist due to IV placement. Pt educated on therapeutic exercises on RLE seated and supine, reporting no increase in pain while performing. Recommending w/c for energy conservation with long distances. Pt must negotiate 3 stairs before being d/c. Will continue to follow acutely in order to improve independence and safety with mobility.     Follow Up Recommendations Supervision for mobility/OOB;No PT follow up    Equipment Recommendations  Rolling walker with 5" wheels;Wheelchair (measurements PT)    Recommendations for Other Services       Precautions / Restrictions Precautions Precautions: Fall Required Braces or Orthoses: Other Brace/Splint Other Brace/Splint: post-op shoe RLE Restrictions Weight Bearing Restrictions: Yes RLE Weight Bearing: Non weight bearing      Mobility  Bed Mobility Overal bed mobility: Needs Assistance Bed Mobility: Supine to Sit     Supine to sit: Min guard     General bed mobility comments: HOB lowered and bed height elevated to simulate bed at d/c. Min G for safety and technique. Pt requires increased time and effort to get EOB. Min cues to not push through R heel with bed mobility.   Transfers Overall transfer level: Needs assistance Equipment used: Rolling walker (2 wheeled) Transfers: Sit to/from  Stand Sit to Stand: Min guard         General transfer comment: Min cues for hand placement and weight bearing precautions. Pt Min G for safety.  Ambulation/Gait Ambulation/Gait assistance: Min guard;+2 physical assistance Gait Distance (Feet): 150 Feet Assistive device: Rolling walker (2 wheeled) Gait Pattern/deviations: Step-to pattern;Trunk flexed(hop-to pattern) Gait velocity: decreased Gait velocity interpretation: <1.31 ft/sec, indicative of household ambulator General Gait Details: Pt slow and guarded initially, with Min G for safety. Seated rest x3 limited by UE fatigue and pain from IV placement at wrist on RUE. Increased height of RW and provided VCs to decrease work of triceps and UE fatigue with pt reporting ambulation was much easier after adjusting RW  Information systems manager Rankin (Stroke Patients Only)       Balance Overall balance assessment: Needs assistance Sitting-balance support: No upper extremity supported;Feet supported Sitting balance-Leahy Scale: Good     Standing balance support: Single extremity supported;During functional activity Standing balance-Leahy Scale: Fair Standing balance comment: At least one UE support required for dynamic standing activity                             Pertinent Vitals/Pain Pain Assessment: Faces Faces Pain Scale: Hurts a little bit Pain Location: R calf Pain Descriptors / Indicators: Aching;Cramping;Guarding Pain Intervention(s): Limited activity within patient's tolerance;Monitored during session;Repositioned    Home Living Family/patient expects to be discharged to:: Private residence Living Arrangements: Parent Available Help at Discharge: Family;Available 24 hours/day  Type of Home: House Home Access: Stairs to enter   Entergy CorporationEntrance Stairs-Number of Steps: 3 Home Layout: One level Home Equipment: Cane - single point;Grab bars - tub/shower;Crutches;Walker - 2  wheels;Shower seat;Bedside commode      Prior Function Level of Independence: Needs assistance   Gait / Transfers Assistance Needed: Used SPC for support           Hand Dominance        Extremity/Trunk Assessment   Upper Extremity Assessment Upper Extremity Assessment: Overall WFL for tasks assessed    Lower Extremity Assessment Lower Extremity Assessment: RLE deficits/detail RLE Deficits / Details: Pt unable to detect light touch to L4 dermatome on dorsal surface along ankle joint line  RLE Sensation: decreased light touch;history of peripheral neuropathy    Cervical / Trunk Assessment Cervical / Trunk Assessment: Normal  Communication   Communication: No difficulties  Cognition Arousal/Alertness: Awake/alert Behavior During Therapy: WFL for tasks assessed/performed Overall Cognitive Status: Within Functional Limits for tasks assessed                                        General Comments General comments (skin integrity, edema, etc.): Pts mother present and engaged in session    Exercises General Exercises - Lower Extremity Ankle Circles/Pumps: AROM;Right;10 reps;Seated Long Arc Quad: AROM;Right;10 reps;Seated   Assessment/Plan    PT Assessment Patient needs continued PT services  PT Problem List Decreased strength;Decreased range of motion;Decreased activity tolerance;Decreased balance;Decreased mobility;Decreased coordination;Decreased knowledge of use of DME;Pain       PT Treatment Interventions DME instruction;Gait training;Stair training;Functional mobility training;Therapeutic activities;Therapeutic exercise;Balance training;Patient/family education    PT Goals (Current goals can be found in the Care Plan section)  Acute Rehab PT Goals Patient Stated Goal: to go home PT Goal Formulation: With patient Time For Goal Achievement: 11/01/17 Potential to Achieve Goals: Good    Frequency Min 3X/week   Barriers to discharge         Co-evaluation               AM-PAC PT "6 Clicks" Daily Activity  Outcome Measure Difficulty turning over in bed (including adjusting bedclothes, sheets and blankets)?: A Little Difficulty moving from lying on back to sitting on the side of the bed? : A Little Difficulty sitting down on and standing up from a chair with arms (e.g., wheelchair, bedside commode, etc,.)?: Unable Help needed moving to and from a bed to chair (including a wheelchair)?: A Lot Help needed walking in hospital room?: A Lot Help needed climbing 3-5 steps with a railing? : A Lot 6 Click Score: 13    End of Session Equipment Utilized During Treatment: Gait belt(Post-op boot) Activity Tolerance: Patient limited by fatigue Patient left: in chair;with call bell/phone within reach;with family/visitor present;with chair alarm set Nurse Communication: Mobility status PT Visit Diagnosis: Other abnormalities of gait and mobility (R26.89);Difficulty in walking, not elsewhere classified (R26.2);Pain Pain - Right/Left: Right Pain - part of body: Leg;Ankle and joints of foot    Time: [id number] PT Time Calculation (min) (ACUTE ONLY): 59 min   Charges:   PT Evaluation $PT Eval Moderate Complexity: 1 Mod PT Treatments $Gait Training: 23-37 mins $Therapeutic Exercise: 8-22 mins        Jason Monroe, SPT  Student Physical Therapist Acute Rehab [phone]   Jason KohutKaylee Teddrick Monroe 10/25/2017, 10:46 AM

## 2017-10-25 NOTE — H&P (View-Only) (Signed)
Patient ID: Jason Monroe, male   DOB: 05/09/1970, 46 y.o.   MRN: [id number] Patient is postoperative day 1 debridement of large abscess right foot.  There is approximately 50 cc of fluid in the wound VAC canister.  Plan for return to the operating room on Wednesday with repeat irrigation debridement and possible split-thickness skin graft.  Intraoperative Gram stains are positive for gram-positive cocci and gram-negative rods, MRSA PCR was negative. 

## 2017-10-26 DIAGNOSIS — L97512 Non-pressure chronic ulcer of other part of right foot with fat layer exposed: Secondary | ICD-10-CM

## 2017-10-26 DIAGNOSIS — R110 Nausea: Secondary | ICD-10-CM

## 2017-10-26 DIAGNOSIS — Z794 Long term (current) use of insulin: Secondary | ICD-10-CM

## 2017-10-26 LAB — CBC
HCT: 34.3 % — ABNORMAL LOW (ref 39.0–52.0)
HEMOGLOBIN: 11.2 g/dL — AB (ref 13.0–17.0)
MCH: 29.6 pg (ref 26.0–34.0)
MCHC: 32.7 g/dL (ref 30.0–36.0)
MCV: 90.5 fL (ref 78.0–100.0)
Platelets: 374 10*3/uL (ref 150–400)
RBC: 3.79 MIL/uL — ABNORMAL LOW (ref 4.22–5.81)
RDW: 11.2 % — ABNORMAL LOW (ref 11.5–15.5)
WBC: 20.7 10*3/uL — ABNORMAL HIGH (ref 4.0–10.5)

## 2017-10-26 LAB — GLUCOSE, CAPILLARY
GLUCOSE-CAPILLARY: 155 mg/dL — AB (ref 70–99)
GLUCOSE-CAPILLARY: 191 mg/dL — AB (ref 70–99)
GLUCOSE-CAPILLARY: 205 mg/dL — AB (ref 70–99)
GLUCOSE-CAPILLARY: 152 mg/dL — AB (ref 70–99)

## 2017-10-26 LAB — BASIC METABOLIC PANEL
ANION GAP: 9 (ref 5–15)
BUN: 5 mg/dL — ABNORMAL LOW (ref 6–20)
CHLORIDE: 97 mmol/L — AB (ref 98–111)
CO2: 28 mmol/L (ref 22–32)
Calcium: 7.9 mg/dL — ABNORMAL LOW (ref 8.9–10.3)
Creatinine, Ser: 0.79 mg/dL (ref 0.61–1.24)
GFR calc Af Amer: >60 mL/min (ref >60)
GFR calc non Af Amer: >60 mL/min (ref >60)
GLUCOSE: 159 mg/dL — AB (ref 70–99)
POTASSIUM: 3.1 mmol/L — AB (ref 3.5–5.1)
Sodium: 134 mmol/L — ABNORMAL LOW (ref 135–145)

## 2017-10-26 MED ORDER — POTASSIUM CHLORIDE CRYS ER 20 MEQ PO TBCR
60.0000 meq | EXTENDED_RELEASE_TABLET | Freq: Once | ORAL | Status: AC
  Administered 2017-10-26: 60 meq via ORAL
  Filled 2017-10-26: qty 3

## 2017-10-26 MED ORDER — INSULIN ASPART 100 UNIT/ML ~~LOC~~ SOLN
3.0000 [IU] | Freq: Three times a day (TID) | SUBCUTANEOUS | Status: DC
  Administered 2017-10-26 – 2017-10-27 (×3): 3 [IU] via SUBCUTANEOUS

## 2017-10-26 NOTE — Progress Notes (Signed)
Subjective:  Jason Monroe is a 47 yo M w/ PMH of T2DM on admit day 3 for infected diabetic foot ulcer  Mr.Bloden was examined and evaluated at bedside this AM with his mother present. Patient is doing okay today. His pain is well controlled. He is having significant nausea and is concerned about that. He thinks it may be related to the medications. Endorses some chills, subjective fevers. Denies tremors, difficulty breathing. He was able to work with PT yesterday and felt good during the exercise and he has tried to stay sit up out of bed for most of the day yesterday.. Continues to use incentive spirometry Q hourly. Discussed the plan for continued antibiotics and return to the OR tomorrow. Discussed the plan for continued glucose control. All questions and concerns addressed. Denis V/D/C.  Objective: Vital signs in last 24 hours: Vitals:   10/25/17 1429 10/25/17 1927 10/25/17 2347 10/26/17 0403  BP: 129/83 137/82 (!) 145/86 140/88  Pulse: 94 97 90 91  Resp: 20 16 16 16   Temp: 98.8 F (37.1 C) 99.5 F (37.5 C) 99.2 F (37.3 C) 99.7 F (37.6 C)  TempSrc: Oral Oral Oral Oral  SpO2: 93% 90% 90% (!) 88%  Weight:      Height:       Physical Exam  Constitutional: He is oriented to person, place, and time and well-developed, well-nourished, and in no distress. No distress.  HENT:  Head: Normocephalic and atraumatic.  Mouth/Throat: Oropharynx is clear and moist. No oropharyngeal exudate.  Eyes: Pupils are equal, round, and reactive to light. Conjunctivae and EOM are normal. No scleral icterus.  Right eye ptosis  Cardiovascular: Normal rate, regular rhythm, normal heart sounds and intact distal pulses.  Pulmonary/Chest: Effort normal and breath sounds normal. No respiratory distress. He has no wheezes. He has no rales.  Abdominal: Soft. Bowel sounds are normal. He exhibits no distension. There is no tenderness. There is no guarding.  Musculoskeletal: Normal range of motion. He exhibits  edema. He exhibits no deformity.  Right foot plantar ulcer with wound vac in place  Neurological: He is alert and oriented to person, place, and time. No cranial nerve deficit. GCS score is 15.  Skin: Skin is warm. He is diaphoretic.  Psychiatric: Mood, memory, affect and judgment normal.   Assessment/Plan:  Principal Problem:   Diabetic ulcer of foot with fat layer exposed (HCC) Active Problems:   Sepsis due to skin infection (HCC)   Type 2 diabetes mellitus with right diabetic foot ulcer (HCC)   Moderate protein-calorie malnutrition (HCC)   Cutaneous abscess of right foot  Mr.Rodino is a 47 yo M w/ PMH of T2DM admit for infected right diabetic foot ulcer s/p I&D on admit day 3. Ortho with plans to return to surgery tomorrow for repeat I&D and possible skin grafting. Currently on Vanc, Rocephin, Flagyl.  Continuing to optimize his diabetic regimen.  Right plantar foot diabetic ulcer Presenting with infected right foot ulcer complicated by uncontrolled diabetes with leukocytosis and systemic symptoms. Status post OR I&D on 10/25/17. On IV antibiotics. Currently endorsing some nausea, no vomiting. Gram stain show gram + cocci, gram variable rod, culture is pending. Wbc20.7<-25 <- 23.1 <-27.7 - F/u wound culture - F/u ABI - Appreciate ortho recs - C/w incentive spirometry -C/w Metronidazole 500mg  q8hr,Ceftriaxone 1g q24, - C/w Vancomycin per pharm -C/wZofran 4mg  q6h PRN for nausea -C/wHydrocodone-acetaminophen q4hr for pain - Expect to discharge with 7-14 days of oral antibiotic therapy depending on culture sensitivities  Uncontrolled  T2DM Currently on 18 units glargine. Required 13 units of aspart during day time. BG approaching goal. 137 before bedtime. 155 fasting this morning. - sliding scale insulin aspart -c/w Lantus 18 units qhs - Start 3 units Novolog TID qc - Glucose checks at mealtime, fasting and bedtime  Dispo: Anticipated discharge in approximately 3 day(s).     Levora Dredge, MD 10/26/2017, 8:57 AM Pager: My Pager: [phone]  Judeth Cornfield, PGY1 Pager: [phone]

## 2017-10-26 NOTE — Plan of Care (Signed)
  Problem: Education: Goal: Knowledge of General Education information will improve Description Including pain rating scale, medication(s)/side effects and non-pharmacologic comfort measures Outcome: Progressing Note:  POC reviewed with pt.   

## 2017-10-27 ENCOUNTER — Encounter (HOSPITAL_COMMUNITY): Payer: Self-pay | Admitting: Orthopedic Surgery

## 2017-10-27 ENCOUNTER — Inpatient Hospital Stay (HOSPITAL_COMMUNITY): Payer: Self-pay | Admitting: Certified Registered Nurse Anesthetist

## 2017-10-27 ENCOUNTER — Encounter (HOSPITAL_COMMUNITY)
Admission: EM | Disposition: A | Discharge status: Home / Self Care | Payer: Self-pay | Source: Home / Self Care | Attending: Internal Medicine

## 2017-10-27 DIAGNOSIS — L089 Local infection of the skin and subcutaneous tissue, unspecified: Secondary | ICD-10-CM

## 2017-10-27 DIAGNOSIS — B954 Other streptococcus as the cause of diseases classified elsewhere: Secondary | ICD-10-CM

## 2017-10-27 HISTORY — PX: I & D EXTREMITY: SHX5045

## 2017-10-27 LAB — GLUCOSE, CAPILLARY
GLUCOSE-CAPILLARY: 137 mg/dL — AB (ref 70–99)
Glucose-Capillary: 159 mg/dL — ABNORMAL HIGH (ref 70–99)
Glucose-Capillary: 141 mg/dL — ABNORMAL HIGH (ref 70–99)
Glucose-Capillary: 173 mg/dL — ABNORMAL HIGH (ref 70–99)

## 2017-10-27 SURGERY — IRRIGATION AND DEBRIDEMENT EXTREMITY
Anesthesia: General | Laterality: Right

## 2017-10-27 MED ORDER — MEPERIDINE HCL 50 MG/ML IJ SOLN: 6.2500 mg | INTRAMUSCULAR | Status: DC | PRN

## 2017-10-27 MED ORDER — BISACODYL 10 MG RE SUPP: 10.0000 mg | Freq: Every day | RECTAL | Status: DC | PRN

## 2017-10-27 MED ORDER — METOCLOPRAMIDE HCL 5 MG/ML IJ SOLN: 5.0000 mg | Freq: Three times a day (TID) | INTRAMUSCULAR | Status: DC | PRN

## 2017-10-27 MED ORDER — ONDANSETRON HCL 4 MG/2ML IJ SOLN: 4.0000 mg | Freq: Once | INTRAMUSCULAR | Status: DC | PRN

## 2017-10-27 MED ORDER — ALBUTEROL SULFATE HFA 108 (90 BASE) MCG/ACT IN AERS
INHALATION_SPRAY | RESPIRATORY_TRACT | Status: DC | PRN
  Administered 2017-10-27: 2 via RESPIRATORY_TRACT

## 2017-10-27 MED ORDER — ONDANSETRON HCL 4 MG/2ML IJ SOLN
INTRAMUSCULAR | Status: AC
  Filled 2017-10-27: qty 2

## 2017-10-27 MED ORDER — ARTIFICIAL TEARS OPHTHALMIC OINT
TOPICAL_OINTMENT | OPHTHALMIC | Status: DC | PRN
  Administered 2017-10-27: 1 via OPHTHALMIC

## 2017-10-27 MED ORDER — ONDANSETRON HCL 4 MG/2ML IJ SOLN
4.0000 mg | Freq: Four times a day (QID) | INTRAMUSCULAR | Status: DC | PRN
  Administered 2017-10-27 – 2017-10-28 (×3): 4 mg via INTRAVENOUS
  Filled 2017-10-27 (×3): qty 2

## 2017-10-27 MED ORDER — ONDANSETRON HCL 4 MG/2ML IJ SOLN
4.0000 mg | Freq: Once | INTRAMUSCULAR | Status: AC
  Administered 2017-10-27: 4 mg via INTRAVENOUS
  Filled 2017-10-27: qty 2

## 2017-10-27 MED ORDER — ONDANSETRON HCL 4 MG PO TABS: 4.0000 mg | ORAL_TABLET | Freq: Four times a day (QID) | ORAL | Status: DC | PRN

## 2017-10-27 MED ORDER — INSULIN ASPART PROT & ASPART (70-30 MIX) 100 UNIT/ML ~~LOC~~ SUSP
13.0000 [IU] | Freq: Two times a day (BID) | SUBCUTANEOUS | Status: DC
  Administered 2017-10-27 – 2017-10-28 (×2): 13 [IU] via SUBCUTANEOUS
  Filled 2017-10-27: qty 10

## 2017-10-27 MED ORDER — LIDOCAINE 2% (20 MG/ML) 5 ML SYRINGE
INTRAMUSCULAR | Status: AC
  Filled 2017-10-27: qty 5

## 2017-10-27 MED ORDER — ONDANSETRON HCL 4 MG/2ML IJ SOLN
INTRAMUSCULAR | Status: DC | PRN
  Administered 2017-10-27: 4 mg via INTRAVENOUS

## 2017-10-27 MED ORDER — SODIUM CHLORIDE 0.9 % IV SOLN: INTRAVENOUS | Status: DC

## 2017-10-27 MED ORDER — MIDAZOLAM HCL 2 MG/2ML IJ SOLN
INTRAMUSCULAR | Status: AC
  Filled 2017-10-27: qty 2

## 2017-10-27 MED ORDER — FENTANYL CITRATE (PF) 250 MCG/5ML IJ SOLN
INTRAMUSCULAR | Status: DC | PRN
  Administered 2017-10-27: 125 ug via INTRAVENOUS

## 2017-10-27 MED ORDER — CEFAZOLIN SODIUM-DEXTROSE 2-4 GM/100ML-% IV SOLN
2.0000 g | INTRAVENOUS | Status: AC
  Administered 2017-10-27: 2 g via INTRAVENOUS
  Filled 2017-10-27: qty 100

## 2017-10-27 MED ORDER — 0.9 % SODIUM CHLORIDE (POUR BTL) OPTIME
TOPICAL | Status: DC | PRN
  Administered 2017-10-27: 1000 mL

## 2017-10-27 MED ORDER — DOCUSATE SODIUM 100 MG PO CAPS
100.0000 mg | ORAL_CAPSULE | Freq: Two times a day (BID) | ORAL | Status: DC
  Administered 2017-10-27 – 2017-10-28 (×3): 100 mg via ORAL
  Filled 2017-10-27 (×3): qty 1

## 2017-10-27 MED ORDER — POLYETHYLENE GLYCOL 3350 17 G PO PACK: 17.0000 g | PACK | Freq: Every day | ORAL | Status: DC | PRN

## 2017-10-27 MED ORDER — HYDROMORPHONE HCL 1 MG/ML IJ SOLN: 0.2500 mg | INTRAMUSCULAR | Status: DC | PRN

## 2017-10-27 MED ORDER — LIDOCAINE HCL (CARDIAC) PF 100 MG/5ML IV SOSY
PREFILLED_SYRINGE | INTRAVENOUS | Status: DC | PRN
  Administered 2017-10-27: 60 mg via INTRATRACHEAL

## 2017-10-27 MED ORDER — SUCCINYLCHOLINE CHLORIDE 200 MG/10ML IV SOSY
PREFILLED_SYRINGE | INTRAVENOUS | Status: AC
  Filled 2017-10-27: qty 10

## 2017-10-27 MED ORDER — MAGNESIUM CITRATE PO SOLN: 1.0000 | Freq: Once | ORAL | Status: DC | PRN

## 2017-10-27 MED ORDER — PROPOFOL 10 MG/ML IV BOLUS
INTRAVENOUS | Status: AC
  Filled 2017-10-27: qty 20

## 2017-10-27 MED ORDER — PROPOFOL 10 MG/ML IV BOLUS
INTRAVENOUS | Status: DC | PRN
  Administered 2017-10-27: 100 mg via INTRAVENOUS

## 2017-10-27 MED ORDER — GLYCOPYRROLATE 0.2 MG/ML IJ SOLN
INTRAMUSCULAR | Status: DC | PRN
  Administered 2017-10-27: 0.2 mg via INTRAVENOUS

## 2017-10-27 MED ORDER — LACTATED RINGERS IV SOLN
INTRAVENOUS | Status: DC
  Administered 2017-10-27: 09:00:00 via INTRAVENOUS

## 2017-10-27 MED ORDER — MIDAZOLAM HCL 5 MG/ML IJ SOLN
INTRAMUSCULAR | Status: DC | PRN
  Administered 2017-10-27 (×2): 1 mg via INTRAVENOUS

## 2017-10-27 MED ORDER — SUCCINYLCHOLINE CHLORIDE 20 MG/ML IJ SOLN
INTRAMUSCULAR | Status: DC | PRN
  Administered 2017-10-27: 120 mg via INTRAVENOUS

## 2017-10-27 MED ORDER — SODIUM CHLORIDE 0.9 % IR SOLN
Status: DC | PRN
  Administered 2017-10-27: 3000 mL

## 2017-10-27 MED ORDER — METHOCARBAMOL 500 MG PO TABS: 500.0000 mg | ORAL_TABLET | Freq: Four times a day (QID) | ORAL | Status: DC | PRN

## 2017-10-27 MED ORDER — INSULIN ASPART PROT & ASPART (70-30 MIX) 100 UNIT/ML ~~LOC~~ SUSP
13.0000 [IU] | Freq: Two times a day (BID) | SUBCUTANEOUS | Status: DC
  Filled 2017-10-27: qty 10

## 2017-10-27 MED ORDER — GLYCOPYRROLATE PF 0.2 MG/ML IJ SOSY
PREFILLED_SYRINGE | INTRAMUSCULAR | Status: AC
  Filled 2017-10-27: qty 1

## 2017-10-27 MED ORDER — ARTIFICIAL TEARS OPHTHALMIC OINT
TOPICAL_OINTMENT | OPHTHALMIC | Status: AC
  Filled 2017-10-27: qty 3.5

## 2017-10-27 MED ORDER — METOCLOPRAMIDE HCL 5 MG PO TABS: 5.0000 mg | ORAL_TABLET | Freq: Three times a day (TID) | ORAL | Status: DC | PRN

## 2017-10-27 MED ORDER — METHOCARBAMOL 1000 MG/10ML IJ SOLN
500.0000 mg | Freq: Four times a day (QID) | INTRAVENOUS | Status: DC | PRN
  Filled 2017-10-27: qty 5

## 2017-10-27 MED ORDER — FENTANYL CITRATE (PF) 250 MCG/5ML IJ SOLN
INTRAMUSCULAR | Status: AC
  Filled 2017-10-27: qty 5

## 2017-10-27 SURGICAL SUPPLY — 39 items
ALLOGRAFT SKIN MESHD 125 SQ CM (Tissue) ×1 IMPLANT
APL SKNCLS STERI-STRIP NONHPOA (GAUZE/BANDAGES/DRESSINGS) ×2
BENZOIN TINCTURE PRP APPL 2/3 (GAUZE/BANDAGES/DRESSINGS) ×4 IMPLANT
BLADE SURG 21 STRL SS (BLADE) ×1 IMPLANT
BNDG COHESIVE 6X5 TAN STRL LF (GAUZE/BANDAGES/DRESSINGS) IMPLANT
BNDG GAUZE ELAST 4 BULKY (GAUZE/BANDAGES/DRESSINGS) ×2 IMPLANT
CANISTER PREVENA PLUS 150 (CANNISTER) ×2 IMPLANT
COVER SURGICAL LIGHT HANDLE (MISCELLANEOUS) ×4 IMPLANT
DRAPE INCISE IOBAN 66X45 STRL (DRAPES) ×2 IMPLANT
DRAPE U-SHAPE 47X51 STRL (DRAPES) ×3 IMPLANT
DRESSING VERAFLO CLEANSE CC (GAUZE/BANDAGES/DRESSINGS) IMPLANT
DRSG ADAPTIC 3X8 NADH LF (GAUZE/BANDAGES/DRESSINGS) ×1 IMPLANT
DRSG VERAFLO CLEANSE CC (GAUZE/BANDAGES/DRESSINGS) ×3
DURAPREP 26ML APPLICATOR (WOUND CARE) ×1 IMPLANT
ELECT REM PT RETURN 9FT ADLT (ELECTROSURGICAL) ×3
ELECTRODE REM PT RTRN 9FT ADLT (ELECTROSURGICAL) IMPLANT
GAUZE SPONGE 4X4 12PLY STRL (GAUZE/BANDAGES/DRESSINGS) ×1 IMPLANT
GLOVE BIOGEL PI IND STRL 9 (GLOVE) ×1 IMPLANT
GLOVE BIOGEL PI INDICATOR 9 (GLOVE) ×2
GLOVE SURG ORTHO 9.0 STRL STRW (GLOVE) ×3 IMPLANT
GOWN STRL REUS W/ TWL XL LVL3 (GOWN DISPOSABLE) ×2 IMPLANT
GOWN STRL REUS W/TWL XL LVL3 (GOWN DISPOSABLE) ×6
GRAFT TISS MESH 125 BURN (Tissue) IMPLANT
HANDPIECE INTERPULSE COAX TIP (DISPOSABLE) ×3
KIT BASIN OR (CUSTOM PROCEDURE TRAY) ×3 IMPLANT
KIT TURNOVER KIT B (KITS) ×3 IMPLANT
MANIFOLD NEPTUNE II (INSTRUMENTS) ×3 IMPLANT
NS IRRIG 1000ML POUR BTL (IV SOLUTION) ×5 IMPLANT
PACK ORTHO EXTREMITY (CUSTOM PROCEDURE TRAY) ×3 IMPLANT
PAD ARMBOARD 7.5X6 YLW CONV (MISCELLANEOUS) ×4 IMPLANT
SET HNDPC FAN SPRY TIP SCT (DISPOSABLE) IMPLANT
SKIN MESHED 125 SQ CM (Tissue) ×3 IMPLANT
STOCKINETTE IMPERVIOUS 9X36 MD (GAUZE/BANDAGES/DRESSINGS) IMPLANT
SWAB COLLECTION DEVICE MRSA (MISCELLANEOUS) ×1 IMPLANT
SWAB CULTURE ESWAB REG 1ML (MISCELLANEOUS) IMPLANT
TOWEL OR 17X26 10 PK STRL BLUE (TOWEL DISPOSABLE) ×3 IMPLANT
TUBE CONNECTING 12'X1/4 (SUCTIONS)
TUBE CONNECTING 12X1/4 (SUCTIONS) ×1 IMPLANT
YANKAUER SUCT BULB TIP NO VENT (SUCTIONS) ×1 IMPLANT

## 2017-10-27 NOTE — Anesthesia Preprocedure Evaluation (Signed)
Anesthesia Evaluation  Patient identified by MRN, date of birth, ID band Patient awake    Reviewed: Allergy & Precautions, NPO status , Patient's Chart, lab work & pertinent test results  Airway Mallampati: I  TM Distance: >3 FB Neck ROM: Full    Dental   Pulmonary Current Smoker,    Pulmonary exam normal        Cardiovascular Normal cardiovascular exam     Neuro/Psych    GI/Hepatic   Endo/Other  diabetes, Type 2, Oral Hypoglycemic Agents  Renal/GU      Musculoskeletal   Abdominal   Peds  Hematology   Anesthesia Other Findings   Reproductive/Obstetrics                             Anesthesia Physical Anesthesia Plan  ASA: III  Anesthesia Plan: General   Post-op Pain Management:    Induction: Intravenous  PONV Risk Score and Plan: 1 and Ondansetron and Treatment may vary due to age or medical condition  Airway Management Planned: LMA  Additional Equipment:   Intra-op Plan:   Post-operative Plan: Extubation in OR  Informed Consent: I have reviewed the patients History and Physical, chart, labs and discussed the procedure including the risks, benefits and alternatives for the proposed anesthesia with the patient or authorized representative who has indicated his/her understanding and acceptance.     Plan Discussed with: CRNA and Surgeon  Anesthesia Plan Comments:         Anesthesia Quick Evaluation

## 2017-10-27 NOTE — Progress Notes (Signed)
L arm IV unable to flush. Pt denies pain. No complications observed to site. Discontinued IV. IV team consulted for replacement after x1 unsuccessful attempt.

## 2017-10-27 NOTE — Progress Notes (Signed)
Subjective:  Jason Monroe is a 47 yo M w/ PMH of T2DM on admit day 4 for infected diabetic foot ulcer  Objective: Jason Monroe was examined and evaluated at bedside this AM with his mother present. Patient states he continues to endorse nausea and vomiting overnight after 'receiving a stool softener.' Foot pain well controlled. Discussed with patient about setting up with primary care provider. He discussed with his mother and decided that he would like to follow up with [facility]. Confirmed with him that he will be set up with for a follow up appointment at the Clinic. Denies any F/D/C/Dyspnea. Urinating normally.  Vital signs in last 24 hours: Vitals:   10/26/17 2230 10/27/17 0459 10/27/17 0803 10/27/17 0824  BP:  (!) 142/86 (!) 150/87   Pulse:  79 79   Resp:  16    Temp:  98.6 F (37 C) 98.6 F (37 C)   TempSrc:  Oral Oral   SpO2: 91% 91%    Weight:    99.8 kg  Height:    6' 0.01" (1.829 m)   Physical Exam  Constitutional: He is oriented to person, place, and time and well-developed, well-nourished, and in no distress. No distress.  HENT:  Head: Normocephalic and atraumatic.  Mouth/Throat: Oropharynx is clear and moist. No oropharyngeal exudate.  Eyes: Pupils are equal, round, and reactive to light. Conjunctivae and EOM are normal. No scleral icterus.  Right eye ptosis  Neck: Normal range of motion. Neck supple.  Cardiovascular: Normal rate, regular rhythm, normal heart sounds and intact distal pulses.  Pulmonary/Chest: Effort normal and breath sounds normal. No respiratory distress. He has no wheezes. He has no rales.  Abdominal: Soft. Bowel sounds are normal. He exhibits no distension. There is no tenderness. There is no guarding.  Musculoskeletal: Normal range of motion. He exhibits edema. He exhibits no deformity.  Right foot with plantar ulcer covered with wound vac  Neurological: He is alert and oriented to person, place, and time. GCS score is 15.    Skin: Skin is warm and dry. He is not diaphoretic.  Psychiatric: Mood, memory, affect and judgment normal.   Assessment/Plan:  Principal Problem:   Diabetic ulcer of foot with fat layer exposed (HCC) Active Problems:   Sepsis due to skin infection (HCC)   Type 2 diabetes mellitus with right diabetic foot ulcer (HCC)   Moderate protein-calorie malnutrition (HCC)   Cutaneous abscess of right foot  Jason Monroe is a 47 yo M w/ PMH of T2DM admit for infected right diabetic foot ulcer s/p I&D on admit day 4. Will be going to OR today for repeat I&D and possible skin grafting. Currently on Vanc, Rocephine, Flagyl. Culture pending but so far growing strep viridans. Will need to make sure he can afford his diabetic medication as well as oral antibiotics on discharge as he has no insurance.  Right plantar foot diabetic ulcer Planned for repeat OR today for I&D. Culture pending - grew strep viridans so far. Currently on systemic antibiotics. No further episodes of Fever overnight. - F/u wound culture - F/U ABI - Appreciate ortho recs - C/w metronidazole 500mg  daily, ceftriaxone 1 g daily, vanc per pharm - Hydrocodone-acetaminophen q4hr PRN for pain - Expect to discharge w/ 2-3 wk oral antibiotic therapy depending on sensitivities  Uncontrolled T2Dm Currently on 18 units glargine. Required 14 units of aspart during day time. BG 152 night time, 159 fasting this AM - Sliding scale insulin aspart - c/w Lantus 18 units  qhs - Start 3 units Novolog TID ac - Will need to switch to Relion 70/30 for affordability before discharge. Will reassess daily insulin requirement post-op  Medication-induced Nausea Complaints of nausea and vomiting after taking 'stool softening' pills. MAR show nausea occurring near Colace administartion. - D/C Colace - C/w Zofran 4mg  q6h PRN for nausea  Dispo: Anticipated discharge in approximately 3 day(s).   Jason Monroe, Jason Sargeant K, MD 10/27/2017, 10:44 AM Pager: [phone]

## 2017-10-27 NOTE — Anesthesia Postprocedure Evaluation (Signed)
Anesthesia Post Note  Patient: Jason Monroe  Procedure(s) Performed: DEBRIDEMENT AND SKIN GRAFT RIGHT FOOT (Right )     Patient location during evaluation: PACU Anesthesia Type: General Level of consciousness: awake and alert Pain management: pain level controlled Vital Signs Assessment: post-procedure vital signs reviewed and stable Respiratory status: spontaneous breathing, nonlabored ventilation, respiratory function stable and patient connected to nasal cannula oxygen Cardiovascular status: blood pressure returned to baseline and stable Postop Assessment: no apparent nausea or vomiting Anesthetic complications: no    Last Vitals:  Vitals:   10/27/17 1210 10/27/17 1245  BP: (!) 133/93   Pulse: 71   Resp: 17   Temp:    SpO2: (!) 89% 92%    Last Pain:  Vitals:   10/27/17 1253  TempSrc:   PainSc: 1                  Maziyah Vessel DAVID

## 2017-10-27 NOTE — Anesthesia Procedure Notes (Signed)
Procedure Name: Intubation Date/Time: 10/27/2017 10:25 AM Performed by: Lillia Abed, MD Pre-anesthesia Checklist: Patient identified, Emergency Drugs available, Suction available and Patient being monitored Patient Re-evaluated:Patient Re-evaluated prior to induction Oxygen Delivery Method: Circle system utilized Preoxygenation: Pre-oxygenation with 100% oxygen Induction Type: IV induction Laryngoscope Size: Mac and 3 Grade View: Grade I Tube type: Oral Tube size: 7.0 mm Number of attempts: 1 Airway Equipment and Method: Stylet Placement Confirmation: ETT inserted through vocal cords under direct vision,  positive ETCO2 and breath sounds checked- equal and bilateral Secured at: 22 cm Tube secured with: Tape Dental Injury: Teeth and Oropharynx as per pre-operative assessment

## 2017-10-27 NOTE — Op Note (Signed)
10/27/2017  10:56 AM  PATIENT:  Jason Monroe    PRE-OPERATIVE DIAGNOSIS:  Abscess Right Foot  POST-OPERATIVE DIAGNOSIS:  Same  PROCEDURE:  DEBRIDEMENT AND SKIN GRAFT RIGHT FOOT, sharp excision with 10 blade knife and rondure to excise skin and soft tissue muscle and fascia. Application of split-thickness skin graft allograft 125 cm. Application of cleanse choice wound VAC.  SURGEON:  Nadara MustardMarcus V Zeshan Sena, MD  PHYSICIAN ASSISTANT:None ANESTHESIA:   General  PREOPERATIVE INDICATIONS:  Jason Monroe is a  47 y.o. male with a diagnosis of Abscess Right Foot who failed conservative measures and elected for surgical management.    The risks benefits and alternatives were discussed with the patient preoperatively including but not limited to the risks of infection, bleeding, nerve injury, cardiopulmonary complications, the need for revision surgery, among others, and the patient was willing to proceed.  OPERATIVE IMPLANTS: 125 cm split-thickness skin graft allograft with cleanse choice wound VAC.  @ENCIMAGES @  OPERATIVE FINDINGS: Patient had resolved approximately 90% of the infection there is still a small area of infection in the first webspace approximately 10 mm in diameter.  This was sharply debrided with a knife and rondure.  OPERATIVE PROCEDURE: Patient was brought the operating room underwent a general anesthetic.  After adequate levels of anesthesia were obtained patient's right lower extremity was prepped using Betadine paint and draped into a sterile field a timeout was called.  Visualization showed good healthy granulation tissue over 90% of the wound.  There is still a small abscess in the first webspace.  This was sharply debrided with a rondure knife to excise skin and soft tissue fascia and muscle back to healthy viable tissue.  The entire wound was irrigated with pulsatile lavage for 3 L.  Electrocautery was used for hemostasis.  The skin graft was secured the wound was 11 x 7 cm.   The cleanse choice wound VAC dressing was applied connected to the Praveena plus pump this had a good suction fit.  Patient was extubated taken the PACU in stable condition.   DISCHARGE PLANNING:  Antibiotic duration: Recommend continuing oral antibiotics for 4 weeks due to the persistent infection.  Weightbearing: Strict nonweightbearing on the right with elevation  Pain medication: Continue current opioid pathway  Dressing care/ Wound VAC: Continue wound VAC and discharged  Ambulatory devices: Walker or crutches  Discharge to: Anticipate discharge to home once the wound cultures are finalized and oral antibiotics are chosen for 4 weeks of oral antibiotics.  Follow-up: In the office 1 week post operative.

## 2017-10-27 NOTE — Progress Notes (Signed)
LFA 20g peripheral IV placed by IV team. IV patent, flushes well, and pt tolerates well. Started Vancomycin dose per orders.

## 2017-10-27 NOTE — Transfer of Care (Signed)
Immediate Anesthesia Transfer of Care Note  Patient: Jason Monroe  Procedure(s) Performed: DEBRIDEMENT AND SKIN GRAFT RIGHT FOOT (Right )  Patient Location: PACU  Anesthesia Type:General  Level of Consciousness: awake, alert  and patient cooperative  Airway & Oxygen Therapy: Patient Spontanous Breathing and Patient connected to face mask oxygen  Post-op Assessment: Report given to RN, Post -op Vital signs reviewed and stable, Patient moving all extremities X 4 and Patient able to stick tongue midline  Post vital signs: Reviewed and stable  Last Vitals:  Vitals Value Taken Time  BP 131/86 10/27/2017 10:54 AM  Temp    Pulse 85 10/27/2017 10:54 AM  Resp 13 10/27/2017 10:54 AM  SpO2 97 % 10/27/2017 10:54 AM  Vitals shown include unvalidated device data.  Last Pain:  Vitals:   10/27/17 0803  TempSrc: Oral  PainSc:          Complications: No apparent anesthesia complications

## 2017-10-27 NOTE — Interval H&P Note (Signed)
History and Physical Interval Note:  10/27/2017 6:39 AM  Jason Monroe  has presented today for surgery, with the diagnosis of Abscess Right Foot  The various methods of treatment have been discussed with the patient and family. After consideration of risks, benefits and other options for treatment, the patient has consented to  Procedure(s): DEBRIDEMENT AND SKIN GRAFT RIGHT FOOT (Right) as a surgical intervention .  The patient's history has been reviewed, patient examined, no change in status, stable for surgery.  I have reviewed the patient's chart and labs.  Questions were answered to the patient's satisfaction.     Nadara MustardMarcus V Duda

## 2017-10-27 NOTE — Plan of Care (Signed)
  Problem: Education: Goal: Knowledge of General Education information will improve Description: Including pain rating scale, medication(s)/side effects and non-pharmacologic comfort measures Outcome: Progressing   Problem: Clinical Measurements: Goal: Will remain free from infection Outcome: Progressing   Problem: Safety: Goal: Ability to remain free from injury will improve Outcome: Progressing   

## 2017-10-27 NOTE — Progress Notes (Signed)
PT Cancellation Note  Patient Details Name: Jason Monroe MRN: [id number] DOB: 12/04/1970   Cancelled Treatment:    Reason Eval/Treat Not Completed: Patient declined. Attempted therapy prior to procedure, but pt declining due to reports of not feeling well and anxiety about procedure. Will continue to follow and check back as schedule allows.    Donzetta KohutKaylee Kimmberly Wisser 10/27/2017, 7:49 AM   Donzetta KohutKaylee Jatoria Kneeland, SPT  Student Physical Therapist Acute Rehab [phone]

## 2017-10-28 ENCOUNTER — Encounter (HOSPITAL_COMMUNITY): Payer: Self-pay | Admitting: Orthopedic Surgery

## 2017-10-28 ENCOUNTER — Telehealth: Payer: Self-pay

## 2017-10-28 LAB — CULTURE, BLOOD (ROUTINE X 2)
Culture: NO GROWTH
Culture: NO GROWTH
Special Requests: ADEQUATE
Special Requests: ADEQUATE

## 2017-10-28 LAB — BASIC METABOLIC PANEL
Anion gap: 8 (ref 5–15)
BUN: <5 mg/dL — ABNORMAL LOW (ref 6–20)
CALCIUM: 7.9 mg/dL — AB (ref 8.9–10.3)
CO2: 30 mmol/L (ref 22–32)
CREATININE: 0.71 mg/dL (ref 0.61–1.24)
Chloride: 99 mmol/L (ref 98–111)
GFR calc non Af Amer: >60 mL/min (ref >60)
Glucose, Bld: 143 mg/dL — ABNORMAL HIGH (ref 70–99)
Potassium: 3.3 mmol/L — ABNORMAL LOW (ref 3.5–5.1)
SODIUM: 137 mmol/L (ref 135–145)

## 2017-10-28 LAB — CBC
HCT: 34.2 % — ABNORMAL LOW (ref 39.0–52.0)
HEMOGLOBIN: 11.3 g/dL — AB (ref 13.0–17.0)
MCH: 29.8 pg (ref 26.0–34.0)
MCHC: 33 g/dL (ref 30.0–36.0)
MCV: 90.2 fL (ref 78.0–100.0)
Platelets: 404 10*3/uL — ABNORMAL HIGH (ref 150–400)
RBC: 3.79 MIL/uL — AB (ref 4.22–5.81)
RDW: 11.1 % — ABNORMAL LOW (ref 11.5–15.5)
WBC: 13.9 10*3/uL — ABNORMAL HIGH (ref 4.0–10.5)

## 2017-10-28 LAB — GLUCOSE, CAPILLARY
GLUCOSE-CAPILLARY: 134 mg/dL — AB (ref 70–99)
Glucose-Capillary: 127 mg/dL — ABNORMAL HIGH (ref 70–99)

## 2017-10-28 MED ORDER — INSULIN NPH ISOPHANE & REGULAR (70-30) 100 UNIT/ML ~~LOC~~ SUSP: SUBCUTANEOUS | 3 refills | Status: DC

## 2017-10-28 MED ORDER — AMOXICILLIN-POT CLAVULANATE 875-125 MG PO TABS: 1.0000 | ORAL_TABLET | Freq: Two times a day (BID) | ORAL | 0 refills | Status: DC

## 2017-10-28 MED ORDER — OXYCODONE HCL 5 MG PO TABS: 5.0000 mg | ORAL_TABLET | Freq: Three times a day (TID) | ORAL | 0 refills | Status: AC | PRN

## 2017-10-28 MED ORDER — BLOOD GLUCOSE MONITOR KIT: PACK | 0 refills | Status: AC

## 2017-10-28 MED ORDER — INSULIN SYRINGES (DISPOSABLE) U-100 0.3 ML MISC: 0 refills | Status: DC

## 2017-10-28 MED ORDER — PEN NEEDLES 31G X 5 MM MISC: 1.0000 [IU] | Freq: Two times a day (BID) | 0 refills | Status: DC

## 2017-10-28 MED ORDER — POTASSIUM CHLORIDE CRYS ER 20 MEQ PO TBCR
40.0000 meq | EXTENDED_RELEASE_TABLET | Freq: Once | ORAL | Status: DC
  Filled 2017-10-28: qty 2

## 2017-10-28 NOTE — Care Management Note (Addendum)
Case Management Note  Patient Details  Name: Eulas PostRoger H Lall MRN: [id number] Date of Birth: 02/14/1971  Subjective/Objective:   47 yr old male admitted with right foot ulcer, underwent debridement with application of Prevena wound vac.                 Action/Plan: Patient provided with MATCH Letter, patient has appointment scheduled with Internal Medicine for followup.    Expected Discharge Date:  10/28/17               Expected Discharge Plan:  Home/Self Care  In-House Referral:  PCP / [facility]  Discharge planning Services  CM Consult, Follow-up appt scheduled, [facility]  Post Acute Care Choice:  NA Choice offered to:  Patient  DME Arranged:  N/A DME Agency:  NA  HH Arranged:  NA HH Agency:  NA  Status of Service:  Completed, signed off  If discussed at Long Length of Stay Meetings, dates discussed:    Additional Comments:  Durenda GuthrieBrady, Emree Locicero Naomi, RN 10/28/2017, 3:40 PM

## 2017-10-28 NOTE — Discharge Instructions (Signed)
Jason Monroe  You came to [place] with an infected diabetic foot ulcer. We have treated you with antibiotics and surgery. You are safe to be discharged. Here are instructions for you at discharge:  START Novolin 70/30 insulin at 13 units at breakfast and dinner START Augmentin 875-125mg  two times daily for 4 weeks START checking your insulin with glucometer. We will provide a script for you at your preferred pharmacy We will prescribe oxycodone 5mg  for 7 day supply for severe pain. Please try to not take it unless your pain is uncontrolled.  Thank you for choosing [facility]  Diabetes and Foot Care Diabetes may cause you to have problems because of poor blood supply (circulation) to your feet and legs. This may cause the skin on your feet to become thinner, break easier, and heal more slowly. Your skin may become dry, and the skin may peel and crack. You may also have nerve damage in your legs and feet causing decreased feeling in them. You may not notice minor injuries to your feet that could lead to infections or more serious problems. Taking care of your feet is one of the most important things you can do for yourself. Follow these instructions at home:  Wear shoes at all times, even in the house. Do not go barefoot. Bare feet are easily injured.  Check your feet daily for blisters, cuts, and redness. If you cannot see the bottom of your feet, use a mirror or ask someone for help.  Wash your feet with warm water (do not use hot water) and mild soap. Then pat your feet and the areas between your toes until they are completely dry. Do not soak your feet as this can dry your skin.  Apply a moisturizing lotion or petroleum jelly (that does not contain alcohol and is unscented) to the skin on your feet and to dry, brittle toenails. Do not apply lotion between your toes.  Trim your toenails straight across. Do not dig under them or around the cuticle. File the edges of your nails with an emery board or nail  file.  Do not cut corns or calluses or try to remove them with medicine.  Wear clean socks or stockings every day. Make sure they are not too tight. Do not wear knee-high stockings since they may decrease blood flow to your legs.  Wear shoes that fit properly and have enough cushioning. To break in new shoes, wear them for just a few hours a day. This prevents you from injuring your feet. Always look in your shoes before you put them on to be sure there are no objects inside.  Do not cross your legs. This may decrease the blood flow to your feet.  If you find a minor scrape, cut, or break in the skin on your feet, keep it and the skin around it clean and dry. These areas may be cleansed with mild soap and water. Do not cleanse the area with peroxide, alcohol, or iodine.  When you remove an adhesive bandage, be sure not to damage the skin around it.  If you have a wound, look at it several times a day to make sure it is healing.  Do not use heating pads or hot water bottles. They may burn your skin. If you have lost feeling in your feet or legs, you may not know it is happening until it is too late.  Make sure your health care provider performs a complete foot exam at least annually or more  often if you have foot problems. Report any cuts, sores, or bruises to your health care provider immediately. Contact a health care provider if:  You have an injury that is not healing.  You have cuts or breaks in the skin.  You have an ingrown nail.  You notice redness on your legs or feet.  You feel burning or tingling in your legs or feet.  You have pain or cramps in your legs and feet.  Your legs or feet are numb.  Your feet always feel cold. Get help right away if:  There is increasing redness, swelling, or pain in or around a wound.  There is a red line that goes up your leg.  Pus is coming from a wound.  You develop a fever or as directed by your health care provider.  You  notice a bad smell coming from an ulcer or wound. This information is not intended to replace advice given to you by your health care provider. Make sure you discuss any questions you have with your health care provider. Document Released: 02/14/2000 Document Revised: 07/25/2015 Document Reviewed: 07/26/2012 Elsevier Interactive Patient Education  2017 [employer]

## 2017-10-28 NOTE — Progress Notes (Signed)
Physical Therapy Treatment Patient Details Name: Jason Monroe MRN: [id number] DOB: 26-Dec-1970 Today's Date: 10/28/2017    History of Present Illness Jason Monroe is a 47 y.o. male s/p irrigation and debridement of the plantar surface of R foot. PMH significant of T2DM.     PT Comments    Pt continues to make great progress towards goals, performing ambulation with min guard. Pt utilized crutches  to negotiate stairs and ambulated with RW for safety and energy conservation. Pt educated on positioning, car transfers, and generalized safety with mobility at d/c. Will continue to follow and progress acutely, per POC.    Follow Up Recommendations  Supervision for mobility/OOB;No PT follow up     Equipment Recommendations  None recommended by PT    Recommendations for Other Services       Precautions / Restrictions Precautions Precautions: Fall Required Braces or Orthoses: Other Brace/Splint Other Brace/Splint: post-op shoe RLE Restrictions Weight Bearing Restrictions: Yes RLE Weight Bearing: Non weight bearing    Mobility  Bed Mobility Overal bed mobility: Needs Assistance Bed Mobility: Supine to Sit     Supine to sit: Supervision     General bed mobility comments: HOB lowered; supervision for safety; pt demonstrated maintence of precautions while transfering EOB to prevent pushing through heel  Transfers   Equipment used: Rolling walker (2 wheeled);Crutches Transfers: Sit to/from Stand Sit to Stand: Min guard         General transfer comment: Pt is min G for safety. Multimodal cues for sit<>stand with use of crutches for hand placement. Instructed pt to place both crutches on R side and initiate push to stand from arm rest and then transfering hand position onto crutches to achieve full standing. Pt then instructed to utilize crutch under both axillas once pt has adequate balane. Pt demonstrates safe hand placement with ascent and descent with use of  RW  Ambulation/Gait Ambulation/Gait assistance: Min guard Gait Distance (Feet): 225 Feet Assistive device: Rolling walker (2 wheeled) Gait Pattern/deviations: Step-to pattern;Trunk flexed(hop-to) Gait velocity: decreased Gait velocity interpretation: <1.31 ft/sec, indicative of household ambulator General Gait Details: Pt showed improved gait velocity with hop-to gait pattern at today's session and reported feeling more comfortable with use of RW for ambulation. Pt required min cues for upright posture. Pt able to maintain NWB on RLE throughout gait without need for cues.    Stairs Stairs: Yes Stairs assistance: Min assist;Min guard Stair Management: One rail Left;One rail Right;Step to pattern;Forwards;With crutches Number of Stairs: 12 General stair comments: Min cues required for sequencing with crutches, ascending with LLE then crutch with use of rail on the left, descending with crutch first then LLE with use of rail on the right.Min A initially progressing to min g for safety. VCs required for RLE positioning keeping foot behind him with ascent and in front with descent.Pt instructed to negotiate stairs with one crutch to improve safety. Pt performed 2 trials and reports he felt that he could safely manage steps at d/c. Verbally instructed on stair negotiation with RW ascending backwards and descending forwards if needed in the future.    Wheelchair Mobility    Modified Rankin (Stroke Patients Only)       Balance Overall balance assessment: Needs assistance Sitting-balance support: No upper extremity supported;Feet supported Sitting balance-Leahy Scale: Good     Standing balance support: Single extremity supported;During functional activity Standing balance-Leahy Scale: Fair Standing balance comment: At least one UE support required for dynamic standing activity  Cognition Arousal/Alertness: Awake/alert Behavior During Therapy: WFL  for tasks assessed/performed Overall Cognitive Status: Within Functional Limits for tasks assessed                                        Exercises      General Comments General comments (skin integrity, edema, etc.): Mother present during session      Pertinent Vitals/Pain Pain Assessment: Faces Faces Pain Scale: Hurts a little bit Pain Location: R calf Pain Descriptors / Indicators: Aching;Cramping;Guarding Pain Intervention(s): Limited activity within patient's tolerance;Monitored during session;Repositioned    Home Living                      Prior Function            PT Goals (current goals can now be found in the care plan section) Acute Rehab PT Goals Patient Stated Goal: to go home PT Goal Formulation: With patient Time For Goal Achievement: 11/01/17 Potential to Achieve Goals: Good Progress towards PT goals: Progressing toward goals    Frequency    Min 3X/week      PT Plan Current plan remains appropriate    Co-evaluation              AM-PAC PT "6 Clicks" Daily Activity  Outcome Measure  Difficulty turning over in bed (including adjusting bedclothes, sheets and blankets)?: None Difficulty moving from lying on back to sitting on the side of the bed? : A Little Difficulty sitting down on and standing up from a chair with arms (e.g., wheelchair, bedside commode, etc,.)?: A Little Help needed moving to and from a bed to chair (including a wheelchair)?: A Little Help needed walking in hospital room?: A Little Help needed climbing 3-5 steps with a railing? : A Little 6 Click Score: 19    End of Session Equipment Utilized During Treatment: Gait belt(post op shoe) Activity Tolerance: Patient tolerated treatment well Patient left: in chair;with call bell/phone within reach;with family/visitor present Nurse Communication: Mobility status PT Visit Diagnosis: Other abnormalities of gait and mobility (R26.89);Difficulty in  walking, not elsewhere classified (R26.2);Pain Pain - Right/Left: Right Pain - part of body: Leg;Ankle and joints of foot     Time: 1340-1423 PT Time Calculation (min) (ACUTE ONLY): 43 min  Charges:  $Gait Training: 38-52 mins                     Donzetta KohutKaylee Merlon Alcorta, [place]  Student Physical Therapist Acute Rehab [phone]    Donzetta KohutKaylee Jaspal Pultz 10/28/2017, 3:03 PM

## 2017-10-28 NOTE — Discharge Summary (Signed)
Name: Jason Monroe MRN: [id number] DOB: Dec 05, 1970 47 y.o. PCP: Patient, No Pcp Per  Date of Admission: 10/23/2017  9:56 AM Date of Discharge: 10/23/2017 3:30 PM Attending Physician: Aldine Contes, MD  Discharge Diagnosis: 1. Right plantar foot diabetic abscess 2. Uncontrolled Type 2 Diabetes Mellitus  Discharge Medications: Allergies as of 10/28/2017      Reactions   Metformin And Related Hives      Medication List    STOP taking these medications   IBUPROFEN-DIPHENHYDRAMINE CIT PO   metFORMIN 500 MG tablet Commonly known as:  GLUCOPHAGE     TAKE these medications   amoxicillin-clavulanate 875-125 MG tablet Commonly known as:  AUGMENTIN Take 1 tablet by mouth 2 (two) times daily for 28 days.   blood glucose meter kit and supplies Kit Dispense based on patient and insurance preference. Use up to four times daily as directed. (FOR ICD-9 250.00, 250.01).   hydrochlorothiazide 25 MG tablet Commonly known as:  HYDRODIURIL Take 1 tablet (25 mg total) by mouth daily.   ibuprofen 200 MG tablet Commonly known as:  ADVIL,MOTRIN Take 600 mg by mouth every 12 (twelve) hours.   insulin NPH-regular Human (70-30) 100 UNIT/ML injection Commonly known as:  NOVOLIN 70/30 Inject 13 units at mealtime twice a day (breakfast and dinner)   Insulin Syringes (Disposable) U-100 0.3 ML Misc 13 units of novolin at breakfast and dinner   oxyCODONE 5 MG immediate release tablet Commonly known as:  Oxy IR/ROXICODONE Take 1 tablet (5 mg total) by mouth every 8 (eight) hours as needed for up to 7 days for severe pain.   Pen Needles 31G X 5 MM Misc 1 Units by Does not apply route 2 (two) times daily.       Disposition and follow-up:   [name] was discharged from [facility] in Stable condition.  At the hospital follow up visit please address:  1.  Right plantar foot diabetic abscess:  - Discharged w/ 4 weeks of Augmentin with pending wound culture -  Ensure he is on right antibiotic regimen when sensitivity results finalize - Ensure his leukocytosis has resolved - Please confirm that he is following up with ortho for his wound vac  2. T2DM: - Discharged on Novolog mix 70/30 13 units BID qc due to cost - Please check his bg readings and optimize diabetic regimen as needed (he was in active infection when his insulin requirements were assessed) - He was also intermittently hypokalemic during admission. Please check and replenish as needed. - Please make sure he has all of his diabetic supplies - Patient states he has allergies to metformin and/or glipizide do not prescribe  3.  Labs / imaging needed at time of follow-up: CBC, BMP  3.  Pending labs/ test needing follow-up: Wound Culture  Follow-up Appointments: Follow-up Information    Newt Minion, MD In 1 week.   Specialty:  Orthopedic Surgery Contact information: [place] [address] [phone]        [place]. Go on 11/02/2017.   Why:  2:15 PM Contact information: [address] [place] [facility] Course by problem list: 1. Right Plantar Foot Abscess: Presented with plantar foot abscess with purulent drainage, elevated WBC and fever. Started on IV Vanc, Flagyl, Rocephin. X-ray revealed no obvious osteo. Ortho took him for I&D on 10/23/17. Wound sample collected. Ortho did repeat  I&D on 10/27/17. Culture grew strep viridans and anaerobic gram negative rods. White count decreased to 13.9 from 27.7. Discharged on 4 week course of Augmentin.  2. Type 2 Diabetes: Presented with uncontrolled diabetes with HgbA1c of 10.7. Not on any medication. Started on sliding scale insulin. BG at goal on 18 units basal, 5 units mealtime. Discharged on 70/30 mix at 13 units BID qc due to cost and uninsured status after one day on this regimen with Bg at goal.  Discharge Vitals:   BP (!)  141/88 (BP Location: Left Arm)   Pulse 70   Temp 97.9 F (36.6 C) (Oral)   Resp 16   Ht 6' 0.01" (1.829 m)   Wt 99.8 kg   SpO2 92%   BMI 29.83 kg/m   Pertinent Labs, Studies, and Procedures:  Results for orders placed or performed during the hospital encounter of 10/23/17 (from the past 24 hour(s))  Glucose, capillary     Status: Abnormal   Collection Time: 10/27/17  9:44 PM  Result Value Ref Range   Glucose-Capillary 173 (H) 70 - 99 mg/dL  CBC     Status: Abnormal   Collection Time: 10/28/17  5:12 AM  Result Value Ref Range   WBC 13.9 (H) 4.0 - 10.5 K/uL   RBC 3.79 (L) 4.22 - 5.81 MIL/uL   Hemoglobin 11.3 (L) 13.0 - 17.0 g/dL   HCT 34.2 (L) 39.0 - 52.0 %   MCV 90.2 78.0 - 100.0 fL   MCH 29.8 26.0 - 34.0 pg   MCHC 33.0 30.0 - 36.0 g/dL   RDW 11.1 (L) 11.5 - 15.5 %   Platelets 404 (H) 150 - 400 K/uL  Basic metabolic panel     Status: Abnormal   Collection Time: 10/28/17  5:12 AM  Result Value Ref Range   Sodium 137 135 - 145 mmol/L   Potassium 3.3 (L) 3.5 - 5.1 mmol/L   Chloride 99 98 - 111 mmol/L   CO2 30 22 - 32 mmol/L   Glucose, Bld 143 (H) 70 - 99 mg/dL   BUN <5 (L) 6 - 20 mg/dL   Creatinine, Ser 0.71 0.61 - 1.24 mg/dL   Calcium 7.9 (L) 8.9 - 10.3 mg/dL   GFR calc non Af Amer >60 >60 mL/min   GFR calc Af Amer >60 >60 mL/min   Anion gap 8 5 - 15  Glucose, capillary     Status: Abnormal   Collection Time: 10/28/17  7:31 AM  Result Value Ref Range   Glucose-Capillary 134 (H) 70 - 99 mg/dL  Glucose, capillary     Status: Abnormal   Collection Time: 10/28/17 11:19 AM  Result Value Ref Range   Glucose-Capillary 127 (H) 70 - 99 mg/dL   Discharge Instructions: [name]  You came to [place] with an infected diabetic foot ulcer. We have treated you with antibiotics and surgery. You are safe to be discharged. Here are instructions for you at discharge:  START Novolin 70/30 insulin at 13 units at breakfast and dinner START Augmentin 875-179m two times daily for 4  weeks START checking your insulin with glucometer. We will provide a script for you at your preferred pharmacy We will prescribe oxycodone 579mfor 7 day supply for severe pain. Please try to not take it unless your pain is uncontrolled.  Thank you for choosing [facility]  Discharge Instructions    Call MD for:  difficulty breathing, headache or visual disturbances   Complete by:  As directed  Call MD for:  persistant dizziness or light-headedness   Complete by:  As directed    Call MD for:  persistant nausea and vomiting   Complete by:  As directed    Call MD for:  redness, tenderness, or signs of infection (pain, swelling, redness, odor or green/yellow discharge around incision site)   Complete by:  As directed    Call MD for:  severe uncontrolled pain   Complete by:  As directed    Call MD for:  temperature >100.4   Complete by:  As directed    Diet - low sodium heart healthy   Complete by:  As directed    Increase activity slowly   Complete by:  As directed      Signed: Mosetta Anis, MD 10/28/2017, 3:34 PM   Pager: [phone]

## 2017-10-28 NOTE — Progress Notes (Signed)
Patient ID: Jason Monroe, male   DOB: 04/28/1970, 47 y.o.   MRN: [id number] Postoperative day 1 repeat debridement of the foot with split-thickness skin graft and application of a Praveena wound VAC.  Patient's intraoperative findings showed a small area of persistent infection this is about a centimeter diameter.  Patient underwent further debridement with application of skin graft.  Would recommend 4 weeks of oral antibiotics once the sensitivities are finalized for the strep viridans.  Discharge with the Praveena portable wound VAC pump.

## 2017-10-28 NOTE — Progress Notes (Signed)
CSW talked with the patient at bedside. Patient states he did not have any medical insurance. He was employed at [employer] temp [employer]. CSW discussed option of inquiring about the [employer] Card at [facility] and provided the patient CSW  Medicaid application.   Antony Blackbirdynthia Erma Joubert, [facility] CenterCSWA Clinical Social Worker [phone]

## 2017-10-28 NOTE — Progress Notes (Signed)
Subjective:  Jason Monroe is a 47 yo M w/ PMH of T2DM on admit day 5 for infected diabetic ulcer.  Jason Monroe was examined and evaluated at bedside this AM with mother present. He states he feels great. 'Had the best night of sleep I've ever had since this all started last night.' Mild nausea but no vomiting. No fevers or chills. Leg pain is well controlled. Denies any Diarrhea/ Constipation/ Cold sweats/ Tremors/ palpitations. Urinating normally. States he looks forward to being discharged and he has great social support at home so he is confident he will be able to recover successfully.  Objective:  Vital signs in last 24 hours: Vitals:   10/27/17 1314 10/27/17 2004 10/28/17 0025 10/28/17 0605  BP: (!) 152/99 140/86 (!) 142/88 (!) 145/89  Pulse: 75 76 76 73  Resp:  16 17 17   Temp: 98.8 F (37.1 C)  98.2 F (36.8 C) 98.1 F (36.7 C)  TempSrc: Oral  Oral Oral  SpO2: 98% 95% 93% 97%  Weight:      Height:       Physical Exam  Constitutional: He is oriented to person, place, and time and well-developed, well-nourished, and in no distress. No distress.  HENT:  Head: Normocephalic and atraumatic.  Mouth/Throat: Oropharynx is clear and moist. No oropharyngeal exudate.  Eyes: Pupils are equal, round, and reactive to light. Conjunctivae and EOM are normal.  Right eye ptosis  Neck: Normal range of motion. Neck supple.  Cardiovascular: Normal rate, regular rhythm, normal heart sounds and intact distal pulses.  Pulmonary/Chest: Effort normal and breath sounds normal. No respiratory distress. He has no wheezes. He has no rales.  Abdominal: Soft. Bowel sounds are normal. He exhibits no distension. There is no tenderness. There is no guarding.  Musculoskeletal: Normal range of motion. He exhibits edema. He exhibits no tenderness.  Right foot plantar ulcer with wound vac in place  Neurological: He is alert and oriented to person, place, and time.  Skin: Skin is warm and dry. He is not  diaphoretic.  Psychiatric: Mood, memory, affect and judgment normal.   Assessment/Plan:  Principal Problem:   Diabetic ulcer of foot with fat layer exposed (HCC) Active Problems:   Sepsis due to skin infection (HCC)   Type 2 diabetes mellitus with right diabetic foot ulcer (HCC)   Moderate protein-calorie malnutrition (HCC)   Cutaneous abscess of right foot  Jason Monroe 47 yo M w/ T2DM admit for infected right diabetic plantar foot ulcer admit day 5. OR yesterday for 2nd I&D. Tolerating procedure well. Ortho recommend discharge with oral antibiotics. Patient stable for discharge. Glucose well controlled on 70/30.  Right Plantar Foot Diabetic Ulcer Tolerated OR yesterday well. Pain well controlled. Culture still pending but showing growth with strep viridans. No fevers overnight. White count down to 13.9 <-20.7 Will d/c today with Augmentin. 4 week duration as recommended by ortho. - C/w incentive spirometry - C/w Hydrocodone-acetaminophen q4hr for pain - Start Augmentin 875/125 PO BID - Stable for discharge  Type 2 Diabetes Mellitus Started 13 units aspart mix BID qc. No episodes of hypoglycemia.  Night time 173, Fasting am 143. Will c/w this regimen on discharge. Follow up with PCP for further management - Sliding scale insulin aspart - c/w Novolog Mix 70/30 13 units BID qc  Medication-induced Nausea Appears to be Resolving - c/w Zofran 4mg  q6h PRN  Dvt prophx: Lovenox Diet: Diabetic Code: Full  Dispo: Anticipated discharge in approximately today(s).   Theotis BarrioLee, Lennis Rader K, MD 10/28/2017, 9:12 AM  Pager: [phone]

## 2017-10-28 NOTE — Progress Notes (Signed)
AVS given and reviewed with pt and pt's mother. Printed prescriptions provided. Pt stated he has equipment at home. Praveena wound vac in place and in working order. Pt to be escorted off the unit via wheelchair by staff member.

## 2017-10-28 NOTE — Plan of Care (Signed)
  Problem: Education: Goal: Knowledge of General Education information will improve Description: Including pain rating scale, medication(s)/side effects and non-pharmacologic comfort measures Outcome: Progressing   Problem: Health Behavior/Discharge Planning: Goal: Ability to manage health-related needs will improve Outcome: Progressing   Problem: Clinical Measurements: Goal: Will remain free from infection Outcome: Progressing   Problem: Nutrition: Goal: Adequate nutrition will be maintained Outcome: Progressing   Problem: Elimination: Goal: Will not experience complications related to urinary retention Outcome: Progressing   Problem: Safety: Goal: Ability to remain free from injury will improve Outcome: Progressing   

## 2017-10-28 NOTE — Telephone Encounter (Signed)
TOC per dr Nedra HaiLee, discharge 8/29/201, appt 11/02/2017.

## 2017-10-29 ENCOUNTER — Telehealth (INDEPENDENT_AMBULATORY_CARE_PROVIDER_SITE_OTHER): Payer: Self-pay | Admitting: Orthopedic Surgery

## 2017-10-29 LAB — AEROBIC/ANAEROBIC CULTURE W GRAM STAIN (SURGICAL/DEEP WOUND)

## 2017-10-29 LAB — AEROBIC/ANAEROBIC CULTURE (SURGICAL/DEEP WOUND)

## 2017-10-29 NOTE — Telephone Encounter (Signed)
Noted. Thank You.

## 2017-10-29 NOTE — Telephone Encounter (Signed)
Can you please advise on message below concerning Rx for pain.

## 2017-10-29 NOTE — Telephone Encounter (Signed)
[name] from [facility] called concerning medication and asked if I would contact patient's mother. Spoke with patient's mother Jason Messier(Jason Monroe) she advised Dr Nedra HaiLee wrote a Rx for (Oxycodone). She said [name] not [facility] the Rx because Dr Nedra HaiLee was not Berk[facility] Harveyauth to write the Rx for the medicine. Jason MessierCora said patient have not had any pain medicine since he left the hospital. The number to contact is [phone]

## 2017-10-29 NOTE — Telephone Encounter (Signed)
Called patient's mother, Jason MessierCora, who reports the patient reports no pain and was not taking anything for pain in the hospital except Tylenol and they do not think he needs the prescription for Oxycodone filled at this time.  Thanks, [employer] SchwabShawn

## 2017-11-02 ENCOUNTER — Ambulatory Visit (INDEPENDENT_AMBULATORY_CARE_PROVIDER_SITE_OTHER): Payer: Self-pay | Admitting: Physician Assistant

## 2017-11-02 ENCOUNTER — Encounter (INDEPENDENT_AMBULATORY_CARE_PROVIDER_SITE_OTHER): Payer: Self-pay | Admitting: Orthopedic Surgery

## 2017-11-02 ENCOUNTER — Ambulatory Visit (INDEPENDENT_AMBULATORY_CARE_PROVIDER_SITE_OTHER): Payer: Self-pay | Admitting: Internal Medicine

## 2017-11-02 ENCOUNTER — Encounter: Payer: Self-pay | Admitting: Internal Medicine

## 2017-11-02 VITALS — Ht 72.0 in | Wt 220.0 lb

## 2017-11-02 VITALS — BP 128/97 | HR 98 | Temp 97.7°F

## 2017-11-02 DIAGNOSIS — Z792 Long term (current) use of antibiotics: Secondary | ICD-10-CM

## 2017-11-02 DIAGNOSIS — L97519 Non-pressure chronic ulcer of other part of right foot with unspecified severity: Secondary | ICD-10-CM

## 2017-11-02 DIAGNOSIS — E440 Moderate protein-calorie malnutrition: Secondary | ICD-10-CM

## 2017-11-02 DIAGNOSIS — Z794 Long term (current) use of insulin: Secondary | ICD-10-CM

## 2017-11-02 DIAGNOSIS — L97512 Non-pressure chronic ulcer of other part of right foot with fat layer exposed: Secondary | ICD-10-CM

## 2017-11-02 DIAGNOSIS — Z945 Skin transplant status: Secondary | ICD-10-CM | POA: Insufficient documentation

## 2017-11-02 DIAGNOSIS — L97412 Non-pressure chronic ulcer of right heel and midfoot with fat layer exposed: Principal | ICD-10-CM

## 2017-11-02 DIAGNOSIS — Z0000 Encounter for general adult medical examination without abnormal findings: Secondary | ICD-10-CM | POA: Insufficient documentation

## 2017-11-02 DIAGNOSIS — E876 Hypokalemia: Secondary | ICD-10-CM | POA: Insufficient documentation

## 2017-11-02 DIAGNOSIS — E11621 Type 2 diabetes mellitus with foot ulcer: Secondary | ICD-10-CM

## 2017-11-02 MED ORDER — INSULIN NPH ISOPHANE & REGULAR (70-30) 100 UNIT/ML ~~LOC~~ SUSP: SUBCUTANEOUS | 3 refills | Status: DC

## 2017-11-02 NOTE — Assessment & Plan Note (Signed)
Reports preprandial CBGs 190-250s; denies hypoglycemic symptoms; compliant with Novolog mix 70/30 13 units BID.  Plan: --increase Novolog mix to 15 units BID --increase CBG testing to pre-prandial and 2hr post prandial; advised to look out for hypogylcemic symptoms and check cbg when these occur --f/u in 2-3 weeks with CBG log for further titration --fyi had diffuse rash when started on glipizide/metformin/hctz combination a year or so ago - hard to tell which caused this reaction but will avoid glipizide/metformin

## 2017-11-02 NOTE — Progress Notes (Signed)
   CC: Diabetic foot infection  HPI:  [name] is a 47 y.o. with a PMH of T2DM, diabetic foot ulcer on right foot presenting to clinic for follow up on hypokalemia, foot ulcer, and DM.  Patient here for hospital follow up; admitted 8/24-8/29/19 with diabetic foot infection s/p 2 I&D by Dr. Lajoyce Monroe. Initially he was septic and placed on broad spectrum antibiotics which was narrowed down to augmentin at discharge based on prelim culture data. A wound vac was placed and recommendation for 4wks of oral antibiotics to complete treatment. Since return home, patient states he has had significant improvement in symptoms; he denies fevers, chills, nausea, vomiting, rash; he does have rare night sweats and some loose stools. He hasn't required ibuprofen or opioid pain medicines for pain control. He states swelling and redness have significantly improved since hospitalization and continue to improve.   Please see problem based Assessment and Plan for status of patients chronic conditions.  Past Medical History:  Diagnosis Date  . Diabetes mellitus without complication (HCC)     Review of Systems:   Negative for fevers, chills, nausea, vomiting, chest pain, shortness of breath.  Physical Exam:  Vitals:   11/02/17 1418  BP: (!) 128/97  Pulse: 98  Temp: 97.7 F (36.5 C)  TempSrc: Oral  SpO2: 96%   GENERAL- alert, co-operative, appears as stated age, not in any distress. HEENT- oral mucosa appears moist. CARDIAC- RRR, no murmurs, rubs or gallops. RESP- Moving equal volumes of air, and clear to auscultation bilaterally, no wheezes or crackles. ABDOMEN- Soft, nontender, bowel sounds present. EXTREMITIES- pulse 2+ radial/ PT, symmetric, some edema around right great toe; mild erythema w/o swelling, induration or increased warmth of right lower leg. SKIN- Warm, dry, no rash or lesion. PSYCH- Normal mood and affect, appropriate thought content and speech.  Assessment & Plan:   See  Encounters Tab for problem based charting.   Patient seen with Dr. Cherene Altes, MD Internal Medicine PGY-3

## 2017-11-02 NOTE — Progress Notes (Signed)
Office Visit Note   Patient: Jason Monroe           Date of Birth: 1970-04-13           MRN: [id number] Visit Date: 11/02/2017              Requested by: No referring provider defined for this encounter. PCP: Patient, No Pcp Per  Chief Complaint  Patient presents with  . Right Foot - Routine Post Op    I&D s/p STSG right foot.       HPI: Patient is a 47 year old male who underwent irrigation and debridement of his right diabetic foot ulcer and split-thickness skin grafting to the plantar surface of his right wound on 10/27/2017.  He had the Praveena VAC placed and it stopped working yesterday.  He went into see his internal medicine doctor today to work on his diabetes management and they ask for him to go ahead and be seen today here.  He is continuing on Augmentin for antibiotic coverage.  He has been maintaining nonweightbearing status and utilizing a knee scooter.  He is not having pain and taking no medications for pain. Assessment & Plan: Visit Diagnoses: No diagnosis found.  Plan: He is going to apply Adaptic, ABD pads and Ace wrapping to the right split thickness skin graft over the plantar surface of his foot.  His mom is can assist with dressing changes and was instructed in the dressing change.  We recommend he continue on his Augmentin antibiotic coverage.  Maintain nonweightbearing status of the right lower extremity and follow-up in 1 week.  Follow-Up Instructions: No follow-ups on file.   Ortho Exam  Patient is alert, oriented, no adenopathy, well-dressed, normal affect, normal respiratory effort. The VAC dressing was removed from the plantar surface of the right foot and the cleanse VAC sponge was removed and shows granulation buds through the fenestrations of the sponge.  There is moderate serosanguineous drainage.  No signs of cellulitis.  There is moderate edema of the foot.  Imaging: No results found. No images are attached to the encounter.  Labs: Lab  Results  Component Value Date   HGBA1C 10.7 (H) 10/23/2017   REPTSTATUS 10/29/2017 FINAL 10/24/2017   GRAMSTAIN  10/24/2017    FEW WBC PRESENT,BOTH PMN AND MONONUCLEAR RARE GRAM POSITIVE COCCI IN PAIRS RARE GRAM VARIABLE ROD    CULT  10/24/2017    FEW VIRIDANS STREPTOCOCCUS FEW BACTEROIDES ORALIS BETA LACTAMASE POSITIVE FEW GROUP B STREP(S.AGALACTIAE)ISOLATED TESTING AGAINST S. AGALACTIAE NOT ROUTINELY PERFORMED DUE TO PREDICTABILITY OF AMP/PEN/VAN SUSCEPTIBILITY. Performed at [facility], [address] [place], [place] [address]      Lab Results  Component Value Date   ALBUMIN 2.9 (L) 10/23/2017    Body mass index is 29.84 kg/m.  Orders:  No orders of the defined types were placed in this encounter.  No orders of the defined types were placed in this encounter.    Procedures: No procedures performed  Clinical Data: No additional findings.  ROS:  All other systems negative, except as noted in the HPI. Review of Systems  Objective: Vital Signs: Ht 6' (1.829 m)   Wt 220 lb (99.8 kg)   BMI 29.84 kg/m   Specialty Comments:  No specialty comments available.  PMFS History: Patient Active Problem List   Diagnosis Date Noted  . Hypokalemia 11/02/2017  . Healthcare maintenance 11/02/2017  . 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 Type 2  diabetes mellitus with right diabetic foot ulcer (HCC)   . Moderate protein-calorie malnutrition (HCC)    Past Medical History:  Diagnosis Date  . Diabetes mellitus without complication (HCC)     No family history on file.  Past Surgical History:  Procedure Laterality Date  . I&D EXTREMITY Right 10/24/2017   Procedure: IRRIGATION AND DEBRIDEMENT OF FOOT;  Surgeon: Nadara Mustard, MD;  Location: [facility] OR;  Service: Orthopedics;  Laterality: Right;  . I&D EXTREMITY Right 10/27/2017   Procedure: DEBRIDEMENT AND SKIN GRAFT RIGHT FOOT;  Surgeon: Nadara Mustard, MD;  Location: [facility] OR;  Service:  Orthopedics;  Laterality: Right;   Social History   Occupational History  . Not on file  Tobacco Use  . Smoking status: Current Every Day Smoker    Types: Pipe  . Smokeless tobacco: Never Used  Substance and Sexual Activity  . Alcohol use: Yes  . Drug use: No  . Sexual activity: Not on file

## 2017-11-02 NOTE — Assessment & Plan Note (Addendum)
Hypokalemic in hospital but appears he had significant N/V during hospitalization. He denies muscle cramps.  Plan: --f/u Bmet today - replete K as necessary  Addendum: Bmet reveals K of 4.6; no further action necessary

## 2017-11-02 NOTE — Patient Instructions (Signed)
For your diabetes: --increase the insulin to 15 units twice daily with meals --try to check sugars before taking insulin and a couple of hours after a meal --continue doing a great job with writing down and checking your sugars  For your infection: --continue the augmentin twice daily until you complete all the pills (4 weeks total) --follow up with Dr. Lajoyce Corners

## 2017-11-02 NOTE — Assessment & Plan Note (Signed)
Given info on orange card; will address HM flu vaccine, PNA vaccines, micro-alb/cr ratio, foot exam, ophtho exam at f/u visits.

## 2017-11-02 NOTE — Assessment & Plan Note (Signed)
Patient s/p debridement x2 with Dr. Lajoyce Corners at recent hospitalization and discharge on 4 wks of augmentin per wound culture results. He endorses feeling well; not having much pain, denies systemic symptoms of infection. His wound vac stopped working yesterday.  On exam he has mild swelling of right great toe but otherwise it appears his RLE swelling and erythema is much improved from when he was in the hospital.  Plan: --continue augmentin to complete 4wk course --Dr. Audrie Lia office able to see him today for wound vac trouble-shooting - appreciate their help in working him in

## 2017-11-03 ENCOUNTER — Inpatient Hospital Stay (INDEPENDENT_AMBULATORY_CARE_PROVIDER_SITE_OTHER): Payer: Self-pay | Admitting: Physician Assistant

## 2017-11-03 ENCOUNTER — Encounter (INDEPENDENT_AMBULATORY_CARE_PROVIDER_SITE_OTHER): Payer: Self-pay | Admitting: Physician Assistant

## 2017-11-03 LAB — BMP8+ANION GAP
ANION GAP: 17 mmol/L (ref 10.0–18.0)
BUN/Creatinine Ratio: 23 — ABNORMAL HIGH (ref 9–20)
BUN: 19 mg/dL (ref 6–24)
CO2: 24 mmol/L (ref 20–29)
CREATININE: 0.83 mg/dL (ref 0.76–1.27)
Calcium: 9.1 mg/dL (ref 8.7–10.2)
Chloride: 96 mmol/L (ref 96–106)
GFR, EST AFRICAN AMERICAN: 123 mL/min/{1.73_m2} (ref >59)
GFR, EST NON AFRICAN AMERICAN: 106 mL/min/{1.73_m2} (ref >59)
Glucose: 298 mg/dL — ABNORMAL HIGH (ref 65–99)
Potassium: 4.8 mmol/L (ref 3.5–5.2)
SODIUM: 137 mmol/L (ref 134–144)

## 2017-11-03 NOTE — Progress Notes (Signed)
Internal Medicine Clinic Attending  I saw and evaluated the patient.  I personally confirmed the key portions of the history and exam documented by Dr. Svalina and I reviewed pertinent patient test results.  The assessment, diagnosis, and plan were formulated together and I agree with the documentation in the resident's note.  

## 2017-11-04 NOTE — Telephone Encounter (Signed)
Patient completed HFU 11/02/2017. Kinnie Feil, RN, BSN

## 2017-11-09 ENCOUNTER — Ambulatory Visit (INDEPENDENT_AMBULATORY_CARE_PROVIDER_SITE_OTHER): Payer: Self-pay | Admitting: Physician Assistant

## 2017-11-09 ENCOUNTER — Encounter (INDEPENDENT_AMBULATORY_CARE_PROVIDER_SITE_OTHER): Payer: Self-pay | Admitting: Physician Assistant

## 2017-11-09 VITALS — Ht 72.0 in | Wt 220.0 lb

## 2017-11-09 DIAGNOSIS — Z945 Skin transplant status: Secondary | ICD-10-CM

## 2017-11-09 DIAGNOSIS — L97519 Non-pressure chronic ulcer of other part of right foot with unspecified severity: Secondary | ICD-10-CM

## 2017-11-09 DIAGNOSIS — E11621 Type 2 diabetes mellitus with foot ulcer: Secondary | ICD-10-CM

## 2017-11-09 DIAGNOSIS — E440 Moderate protein-calorie malnutrition: Secondary | ICD-10-CM

## 2017-11-09 MED ORDER — SILVER SULFADIAZINE 1 % EX CREA: 1.0000 "application " | TOPICAL_CREAM | Freq: Every day | CUTANEOUS | 0 refills | Status: DC

## 2017-11-09 NOTE — Progress Notes (Signed)
Office Visit Note   Patient: Jason Monroe           Date of Birth: 21-Oct-1970           MRN: [id number] Visit Date: 11/09/2017              Requested by: No referring provider defined for this encounter. PCP: Patient, No Pcp Per  Chief Complaint  Patient presents with  . Right Foot - Routine Post Op    10/27/17 I&D right foot STSG      HPI: Patient is a 47 year old male who underwent irrigation and debridement of his right diabetic foot ulcer with split thickness skin grafting to the plantar surface of his right foot wound on 10/27/2017.  The Praveena VAC was removed last week.  He has been nonweightbearing and utilizing a knee scooter to avoid any weightbearing.  He has not been taking any pain medication.  He has been applying Adaptic ABD pads and Ace wrapping to the foot but reports moderate drainage.  He was on Augmentin after surgery and this was continued.  He reports no pain over the area but moderate drainage.  He also reports not feeling as well today.  And he did have a diarrheal episode while here in the clinic and was feeling very flushed and hot.  He reports his blood sugar was in the 200s at lunchtime today and we did check his blood sugar here in the clinic and it was 234.  Assessment & Plan: Visit Diagnoses:  1. Status post split thickness skin graft   2. Type 2 diabetes mellitus with right diabetic foot ulcer (HCC)   3. Moderate protein-calorie malnutrition (HCC)     Plan: We are going to start Silvadene cream to the grafted area daily after gentle gentle cleaning of the area then ABD pad Kerlix and an Ace wrap for edema control.  Continue nonweightbearing over the right foot utilizing his knee scooter.  Due to his onset of diarrhea we have stopped his Augmentin as there does not appear to be any signs or symptoms of infection.  He was instructed to also begin probiotics for recolonization of the bowel.  He was also instructed to see his primary care provider should he  have persistent nausea and diarrhea or to seek medical care if his diarrhea persists.  He was afebrile in the clinic today.  He will follow-up in 1 week.  Follow-Up Instructions: Return in about 1 week (around 11/16/2017).   Ortho Exam  Patient is alert, oriented, no adenopathy, well-dressed, normal affect, normal respiratory effort.  Patient reported not feeling as well today.  He is afebrile.  He did have an episode of nausea and then diarrhea during the visit.  He became somewhat diaphoretic but was feeling better after the episode of diarrhea and after some ice water. Examination of the right foot does show moderate serosanguineous drainage.  No odor.  No signs of cellulitis.  He has good pedal pulse in the right foot over the dorsalis pedis.  There is some necrosis of the split-thickness skin graft but no signs of infection.  Imaging: No results found. No images are attached to the encounter.  Labs: Lab Results  Component Value Date   HGBA1C 10.7 (H) 10/23/2017   REPTSTATUS 10/29/2017 FINAL 10/24/2017   GRAMSTAIN  10/24/2017    FEW WBC PRESENT,BOTH PMN AND MONONUCLEAR RARE GRAM POSITIVE COCCI IN PAIRS RARE GRAM VARIABLE ROD    CULT  10/24/2017  FEW VIRIDANS STREPTOCOCCUS FEW BACTEROIDES ORALIS BETA LACTAMASE POSITIVE FEW GROUP B STREP(S.AGALACTIAE)ISOLATED TESTING AGAINST S. AGALACTIAE NOT ROUTINELY PERFORMED DUE TO PREDICTABILITY OF AMP/PEN/VAN SUSCEPTIBILITY. Performed at [facility], [address] [place], [place] [address]      Lab Results  Component Value Date   ALBUMIN 2.9 (L) 10/23/2017    Body mass index is 29.84 kg/m.  Orders:  No orders of the defined types were placed in this encounter.  Meds ordered this encounter  Medications  . silver sulfADIAZINE (SILVADENE) 1 % cream    Sig: Apply 1 application topically daily. Apply to right foot daily after gentle cleaning    Dispense:  400 g    Refill:  0     Procedures: No procedures  performed  Clinical Data: No additional findings.  ROS:  All other systems negative, except as noted in the HPI. Review of Systems  Objective: Vital Signs: Ht 6' (1.829 m)   Wt 220 lb (99.8 kg)   BMI 29.84 kg/m   Specialty Comments:  No specialty comments available.  PMFS History: Patient Active Problem List   Diagnosis Date Noted  . Hypokalemia 11/02/2017  . Healthcare maintenance 11/02/2017  . Status post split thickness skin graft 11/02/2017  . 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 Type 2 diabetes mellitus with right diabetic foot ulcer (HCC)   . Moderate protein-calorie malnutrition (HCC)    Past Medical History:  Diagnosis Date  . Diabetes mellitus without complication (HCC)     Family History  Problem Relation Age of Onset  . Arrhythmia Sister   . Diabetes type I Maternal Grandmother     Past Surgical History:  Procedure Laterality Date  . I&D EXTREMITY Right 10/24/2017   Procedure: IRRIGATION AND DEBRIDEMENT OF FOOT;  Surgeon: Nadara Mustard, MD;  Location: [facility] OR;  Service: Orthopedics;  Laterality: Right;  . I&D EXTREMITY Right 10/27/2017   Procedure: DEBRIDEMENT AND SKIN GRAFT RIGHT FOOT;  Surgeon: Nadara Mustard, MD;  Location: [facility] OR;  Service: Orthopedics;  Laterality: Right;   Social History   Occupational History  . Not on file  Tobacco Use  . Smoking status: Former Smoker    Types: Pipe  . Smokeless tobacco: Never Used  . Tobacco comment: quit 46yrs ago  Substance and Sexual Activity  . Alcohol use: Not Currently  . Drug use: No  . Sexual activity: Not on file

## 2017-11-10 ENCOUNTER — Encounter (INDEPENDENT_AMBULATORY_CARE_PROVIDER_SITE_OTHER): Payer: Self-pay | Admitting: Physician Assistant

## 2017-11-15 ENCOUNTER — Encounter (INDEPENDENT_AMBULATORY_CARE_PROVIDER_SITE_OTHER): Payer: Self-pay | Admitting: Physician Assistant

## 2017-11-15 ENCOUNTER — Ambulatory Visit (INDEPENDENT_AMBULATORY_CARE_PROVIDER_SITE_OTHER): Payer: Self-pay | Admitting: Physician Assistant

## 2017-11-15 VITALS — Ht 72.0 in | Wt 220.0 lb

## 2017-11-15 DIAGNOSIS — L97519 Non-pressure chronic ulcer of other part of right foot with unspecified severity: Secondary | ICD-10-CM

## 2017-11-15 DIAGNOSIS — E11621 Type 2 diabetes mellitus with foot ulcer: Secondary | ICD-10-CM

## 2017-11-15 DIAGNOSIS — Z945 Skin transplant status: Secondary | ICD-10-CM

## 2017-11-15 NOTE — Progress Notes (Signed)
Office Visit Note   Patient: Jason Monroe           Date of Birth: 10/11/1970           MRN: [id number] Visit Date: 11/15/2017              Requested by: No referring provider defined for this encounter. PCP: Bridget HartshornBloomfield, Carley D, DO  Chief Complaint  Patient presents with  . Right Foot - Wound Check, Routine Post Op      HPI: Patient is a 47 year old male who is seen for postoperative follow-up following split-thickness skin grafting following irrigation debridement of her right foot plantar surface ulcer.  He has been utilizing a knee scooter for mobility.  He is completed course of antibiotics and his Augmentin was stopped last week due to episodes of diarrhea.  He reports no further episodes.  He has been taking probiotics as well.  He is getting good protein supplementation as well.  He does have moderate wound drainage but no pain.  He is been applying Silvadene cream to the foot daily and rewrapping.  Assessment & Plan: Visit Diagnoses:  1. Status post split thickness skin graft   2. Type 2 diabetes mellitus with right diabetic foot ulcer (HCC)     Plan: He can shower and use soap and water to the foot.  His staples were harvested today and any necrotic graft was debrided from the wound bed.  He will continue to apply Silvadene cream to the area daily cover with gauze and wrap with Ace wrap for edema control.  Continued offloading with knee scooter and nonweightbearing through his foot.  Continue protein supplementation and probiotics.  He will follow-up in 1 week.  Counseled patient he may require further grafting over the next several weeks to fill in the residual defect over his foot.  Follow-Up Instructions: No follow-ups on file.   Ortho Exam  Patient is alert, oriented, no adenopathy, well-dressed, normal affect, normal respiratory effort. Right foot plantar surface skin graft staples were harvested this visit.  There is no signs of cellulitis.  Some of the graft is  necrotic and is beginning to slough off and this was debrided.  There is minimal edema of the foot.  There is some moderate serosanguineous drainage but no odor.  No other signs of cellulitis.  The wound is approximately 8.5 x 5.5 with 2.5 cm depth. Imaging: No results found. No images are attached to the encounter.  Labs: Lab Results  Component Value Date   HGBA1C 10.7 (H) 10/23/2017   REPTSTATUS 10/29/2017 FINAL 10/24/2017   GRAMSTAIN  10/24/2017    FEW WBC PRESENT,BOTH PMN AND MONONUCLEAR RARE GRAM POSITIVE COCCI IN PAIRS RARE GRAM VARIABLE ROD    CULT  10/24/2017    FEW VIRIDANS STREPTOCOCCUS FEW BACTEROIDES ORALIS BETA LACTAMASE POSITIVE FEW GROUP B STREP(S.AGALACTIAE)ISOLATED TESTING AGAINST S. AGALACTIAE NOT ROUTINELY PERFORMED DUE TO PREDICTABILITY OF AMP/PEN/VAN SUSCEPTIBILITY. Performed at [facility], [address] [place], [place] [id number]      Lab Results  Component Value Date   ALBUMIN 2.9 (L) 10/23/2017    Body mass index is 29.84 kg/m.  Orders:  No orders of the defined types were placed in this encounter.  No orders of the defined types were placed in this encounter.    Procedures: No procedures performed  Clinical Data: No additional findings.  ROS:  All other systems negative, except as noted in the HPI. Review of Systems  Objective: Vital  Signs: Ht 6' (1.829 m)   Wt 220 lb (99.8 kg)   BMI 29.84 kg/m   Specialty Comments:  No specialty comments available.  PMFS History: Patient Active Problem List   Diagnosis Date Noted  . Hypokalemia 11/02/2017  . Healthcare maintenance 11/02/2017  . Status post split thickness skin graft 11/02/2017  . 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 Type 2 diabetes mellitus with right diabetic foot ulcer (HCC)   . Moderate protein-calorie malnutrition (HCC)    Past Medical History:  Diagnosis Date  . Diabetes mellitus without complication (HCC)     Family History   Problem Relation Age of Onset  . Arrhythmia Sister   . Diabetes type I Maternal Grandmother     Past Surgical History:  Procedure Laterality Date  . I&D EXTREMITY Right 10/24/2017   Procedure: IRRIGATION AND DEBRIDEMENT OF FOOT;  Surgeon: Nadara Mustard, MD;  Location: [facility] OR;  Service: Orthopedics;  Laterality: Right;  . I&D EXTREMITY Right 10/27/2017   Procedure: DEBRIDEMENT AND SKIN GRAFT RIGHT FOOT;  Surgeon: Nadara Mustard, MD;  Location: [facility] OR;  Service: Orthopedics;  Laterality: Right;   Social History   Occupational History  . Not on file  Tobacco Use  . Smoking status: Former Smoker    Types: Pipe  . Smokeless tobacco: Never Used  . Tobacco comment: quit 35yrs ago  Substance and Sexual Activity  . Alcohol use: Not Currently  . Drug use: No  . Sexual activity: Not on file

## 2017-11-16 ENCOUNTER — Other Ambulatory Visit: Payer: Self-pay

## 2017-11-16 ENCOUNTER — Encounter: Payer: Self-pay | Admitting: Internal Medicine

## 2017-11-16 ENCOUNTER — Ambulatory Visit (INDEPENDENT_AMBULATORY_CARE_PROVIDER_SITE_OTHER): Payer: Self-pay | Admitting: Internal Medicine

## 2017-11-16 VITALS — BP 142/95 | HR 104 | Temp 97.4°F | Ht 72.0 in

## 2017-11-16 DIAGNOSIS — L97519 Non-pressure chronic ulcer of other part of right foot with unspecified severity: Principal | ICD-10-CM

## 2017-11-16 DIAGNOSIS — E118 Type 2 diabetes mellitus with unspecified complications: Secondary | ICD-10-CM

## 2017-11-16 DIAGNOSIS — E11621 Type 2 diabetes mellitus with foot ulcer: Secondary | ICD-10-CM

## 2017-11-16 DIAGNOSIS — L97512 Non-pressure chronic ulcer of other part of right foot with fat layer exposed: Secondary | ICD-10-CM

## 2017-11-16 DIAGNOSIS — Z794 Long term (current) use of insulin: Secondary | ICD-10-CM

## 2017-11-16 DIAGNOSIS — L97412 Non-pressure chronic ulcer of right heel and midfoot with fat layer exposed: Secondary | ICD-10-CM

## 2017-11-16 MED ORDER — INSULIN NPH ISOPHANE & REGULAR (70-30) 100 UNIT/ML ~~LOC~~ SUSP: SUBCUTANEOUS | 3 refills | Status: DC

## 2017-11-16 NOTE — Progress Notes (Signed)
   CC: diabetes  HPI:  [name] is a 47 y.o. with a PMH of T2DM, diabetic foot ulcer on right foot presenting to clinic for follow up on diabetes.  DM: At last visit, novolog mix was increased to 15 units BID; patient states he has done well with this change and noticed somewhat of a trend down in his CBGs. He has also started noticing how food affects his CBGs. He has tried to cut back on regular sodas and is drinking diet sodas and water; he is deferring to tortillas over white bread. He denies hypoglycemic episodes. He did have one episode of nausea, diaphoresis after wheeling himself to the ortho office in 90+ degree whether that resolved soon after getting inside; cbg at that time was ~200 per patient. He denies chest pain, shortness of breath, other episodes of nausea.   Two weeks ago, his augmentin was stopped by ortho due to GI SE; his wound vac was removed. Since then he has had no diarrhea or abdominal pain; he denies further LE swelling, redness, erythema and denies fevers.   Please see problem based Assessment and Plan for status of patients chronic conditions.  Past Medical History:  Diagnosis Date  . Diabetes mellitus without complication (HCC)     Review of Systems:   Per HPI  Physical Exam:  Vitals:   11/16/17 1416  BP: (!) 142/95  Pulse: (!) 104  Temp: (!) 97.4 F (36.3 C)  TempSrc: Oral  SpO2: 97%  Height: 6' (1.829 m)   GENERAL- alert, co-operative, appears as stated age, not in any distress. CARDIAC- RRR, no murmurs, rubs or gallops. RESP- Moving equal volumes of air, and clear to auscultation bilaterally, no wheezes or crackles. ABDOMEN- Soft, nontender, bowel sounds present. EXTREMITIES- pulse 2+ PT bil; right foot in bandage - from what could be assessed there is no induration, erythema or tenderness. SKIN- Warm, dry, no rash or lesion.  Assessment & Plan:   See Encounters Tab for problem based charting.   Patient discussed with Dr.  Fredrich RomansMullen   Elizah Mierzwa, MD Internal Medicine PGY-3

## 2017-11-16 NOTE — Patient Instructions (Signed)
Increase the insulin to 18 units with breakfast and with dinner. Work on [employer] Mutualdecreasing diet soda and focusing more on protein and healthy starches.

## 2017-11-17 ENCOUNTER — Encounter: Payer: Self-pay | Admitting: Internal Medicine

## 2017-11-17 NOTE — Assessment & Plan Note (Signed)
Seems to be stable; he is now off of antibiotics due to GI SEs and denies infectious symptoms. This should be ok as he had I&D and no signs of osteomyelitis or persistent infection so source control should have been obtained.  Plan: --continue f/u with ortho --advised to rtc or ortho if he develops fevers, worsening drainage/pain/swelling

## 2017-11-17 NOTE — Assessment & Plan Note (Addendum)
Review of glucometer reveals fasting AM CBGs 222-327; PM 172-250s; he is still uncontrolled but seems to have a downward trend over the last couple of weeks.  Plan: --increase Novolin mix to 18 units BID wc --advised drinking mainly water, continued work on diet; he is eager to have more mobility so he can exercise and lose weight  --f/u in 2 wks with glucometer for further titration --in the future when he can afford other therapies, he would benefit from SGLT2I, GLP1 therapy

## 2017-11-22 ENCOUNTER — Encounter (INDEPENDENT_AMBULATORY_CARE_PROVIDER_SITE_OTHER): Payer: Self-pay | Admitting: Physician Assistant

## 2017-11-22 ENCOUNTER — Ambulatory Visit (INDEPENDENT_AMBULATORY_CARE_PROVIDER_SITE_OTHER): Payer: Self-pay | Admitting: Physician Assistant

## 2017-11-22 VITALS — Ht 72.0 in | Wt 220.0 lb

## 2017-11-22 DIAGNOSIS — E440 Moderate protein-calorie malnutrition: Secondary | ICD-10-CM

## 2017-11-22 DIAGNOSIS — L97519 Non-pressure chronic ulcer of other part of right foot with unspecified severity: Secondary | ICD-10-CM

## 2017-11-22 DIAGNOSIS — R7309 Other abnormal glucose: Secondary | ICD-10-CM

## 2017-11-22 DIAGNOSIS — L97512 Non-pressure chronic ulcer of other part of right foot with fat layer exposed: Secondary | ICD-10-CM

## 2017-11-22 DIAGNOSIS — E11621 Type 2 diabetes mellitus with foot ulcer: Secondary | ICD-10-CM

## 2017-11-23 ENCOUNTER — Encounter (INDEPENDENT_AMBULATORY_CARE_PROVIDER_SITE_OTHER): Payer: Self-pay | Admitting: Physician Assistant

## 2017-11-23 NOTE — Progress Notes (Signed)
Office Visit Note   Patient: Jason Monroe           Date of Birth: 10/16/1970           MRN: [id number] Visit Date: 11/22/2017              Requested by: Lenward ChancellorBloomfield, Carley D, DO [address] [place], [place] [address] PCP: Bridget HartshornBloomfield, Carley D, DO  Chief Complaint  Patient presents with  . Right Foot - Routine Post Op      HPI:  Patient is a 47 year old male who is seen for postoperative follow-up following debridements of his right foot ulcer and placement of split-thickness skin grafting on 10/27/2017.  He has been utilizing Silvadene to the wound bed.  He is nonweightbearing utilizing a knee scooter.  He reports some continued moderate serous drainage no odor and no pain over the area.   Assessment & Plan: Visit Diagnoses:  1. 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 Type 2 diabetes mellitus with right diabetic foot ulcer (HCC)   3. Moderate protein-calorie malnutrition (HCC)   4. Elevated hemoglobin A1c     Plan: Instructed the patient he may now shower and get the area wet and utilize Dial soap to the area with showering.  He will then apply Silvadene cream to the area daily and wrap with gauze Kerlix and an Ace wrap.  He will continue offloading utilizing his knee scooter and remain nonweightbearing.  He will follow-up in 2 weeks.  Follow-Up Instructions: Return in about 2 weeks (around 12/06/2017).   Ortho Exam  Patient is alert, oriented, no adenopathy, well-dressed, normal affect, normal respiratory effort. The right foot wound bed has incorporated the graft well.  The wound continues to improve and has good pink granulation and bleeding granulation present.  The overall wound dimensions are 11 x 5.5 x 1.2 cm.  There are no signs of cellulitis.  Imaging: No results found.   Labs: Lab Results  Component Value Date   HGBA1C 10.7 (H) 10/23/2017   REPTSTATUS 10/29/2017 FINAL 10/24/2017   GRAMSTAIN  10/24/2017    FEW WBC PRESENT,BOTH PMN AND MONONUCLEAR RARE GRAM POSITIVE COCCI IN PAIRS RARE GRAM VARIABLE ROD    CULT  10/24/2017    FEW VIRIDANS STREPTOCOCCUS FEW BACTEROIDES ORALIS BETA LACTAMASE POSITIVE FEW GROUP B STREP(S.AGALACTIAE)ISOLATED TESTING AGAINST S. AGALACTIAE NOT ROUTINELY PERFORMED DUE TO PREDICTABILITY OF AMP/PEN/VAN SUSCEPTIBILITY. Performed at [facility], [address] [place], [place] [address]      Lab Results  Component Value Date   ALBUMIN 2.9 (L) 10/23/2017    Body mass index is 29.84 kg/m.  Orders:  No orders of the defined types were placed in this encounter.  No orders of the defined types were placed in this encounter.    Procedures: No procedures performed  Clinical Data: No additional findings.  ROS:  All other systems negative, except as noted in the HPI. Review of Systems  Objective: Vital Signs: Ht 6' (1.829 m)   Wt 220 lb (99.8 kg)   BMI 29.84 kg/m   Specialty Comments:  No specialty comments available.  PMFS History: Patient Active Problem List   Diagnosis Date Noted  . Healthcare maintenance 11/02/2017  . Status post split thickness skin graft 11/02/2017  . 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 Type 2 diabetes mellitus with right diabetic foot ulcer (HCC)   . Moderate protein-calorie  malnutrition [facility])    Past Medical History:  Diagnosis Date  . Diabetes mellitus without complication (HCC)   . 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 Arrhythmia Sister   . Diabetes type I Maternal Grandmother     Past Surgical History:  Procedure Laterality Date  . I&D EXTREMITY Right 10/24/2017   Procedure: IRRIGATION AND DEBRIDEMENT OF FOOT;  Surgeon: Nadara Mustard, MD;  Location: [facility] OR;  Service: Orthopedics;  Laterality: Right;  . I&D EXTREMITY Right 10/27/2017   Procedure: DEBRIDEMENT AND SKIN GRAFT RIGHT FOOT;  Surgeon:  Nadara Mustard, MD;  Location: [facility] OR;  Service: Orthopedics;  Laterality: Right;   Social History   Occupational History  . Not on file  Tobacco Use  . Smoking status: Former Smoker    Types: Pipe  . Smokeless tobacco: Never Used  . Tobacco comment: quit 91yrs ago  Substance and Sexual Activity  . Alcohol use: Not Currently  . Drug use: No  . Sexual activity: Not on file

## 2017-11-25 NOTE — Progress Notes (Signed)
Internal Medicine Clinic Attending  Case discussed with Dr. Svalina  at the time of the visit.  We reviewed the resident's history and exam and pertinent patient test results.  I agree with the assessment, diagnosis, and plan of care documented in the resident's note.  

## 2017-11-29 ENCOUNTER — Inpatient Hospital Stay (INDEPENDENT_AMBULATORY_CARE_PROVIDER_SITE_OTHER): Payer: Self-pay | Admitting: Physician Assistant

## 2017-12-01 ENCOUNTER — Ambulatory Visit (INDEPENDENT_AMBULATORY_CARE_PROVIDER_SITE_OTHER): Payer: Self-pay | Admitting: Internal Medicine

## 2017-12-01 ENCOUNTER — Encounter (INDEPENDENT_AMBULATORY_CARE_PROVIDER_SITE_OTHER): Payer: Self-pay

## 2017-12-01 ENCOUNTER — Other Ambulatory Visit: Payer: Self-pay

## 2017-12-01 ENCOUNTER — Encounter: Payer: Self-pay | Admitting: Internal Medicine

## 2017-12-01 VITALS — BP 139/108 | HR 106 | Temp 98.2°F | Ht 72.0 in

## 2017-12-01 DIAGNOSIS — L97412 Non-pressure chronic ulcer of right heel and midfoot with fat layer exposed: Secondary | ICD-10-CM

## 2017-12-01 DIAGNOSIS — L97519 Non-pressure chronic ulcer of other part of right foot with unspecified severity: Principal | ICD-10-CM

## 2017-12-01 DIAGNOSIS — E11621 Type 2 diabetes mellitus with foot ulcer: Secondary | ICD-10-CM

## 2017-12-01 DIAGNOSIS — L97512 Non-pressure chronic ulcer of other part of right foot with fat layer exposed: Secondary | ICD-10-CM

## 2017-12-01 DIAGNOSIS — Z794 Long term (current) use of insulin: Secondary | ICD-10-CM

## 2017-12-01 MED ORDER — INSULIN NPH ISOPHANE & REGULAR (70-30) 100 UNIT/ML ~~LOC~~ SUSP: 20.0000 [IU] | Freq: Two times a day (BID) | SUBCUTANEOUS | 3 refills | Status: DC

## 2017-12-01 NOTE — Progress Notes (Signed)
   CC: DM follow-up   HPI:   [name] is a 47 y.o. male with PMHx of DM type II and Diabetic foot ulcer s/p debridement and split thickness skin grafting on 10/27/17 who presents for follow-up of his DM. At his previous visit on 9/17, his Novolin was increased from 15 to 18 units BID. He has tolerated this increase well, and his fasting blood glucose levels have trended down from 220-320 to 160-200 since increasing the Novolin. He denies any diaphoresis, lightheadedness, syncope or hypoglycemic events. He is living with his mom who helps him carb count and keeps a thorough log of his CBGs. Patient is looking forward to becoming more active once his skin graft has healed.  He is followed closely by Dr. Lajoyce Corners for foot ulcer s/p skin graft.  Per chart review, it is healing well but may require an additional graft due to the large size of the wound. His mom changes the dressing every day and applies Silvadene cream as prescribed by ortho. There has been no change in color or increased amount of drainage. Denies fevers, foot pain, erythema, or swelling. No chest pain, shortness of breath, abdominal pain, n/v/d.   Past Medical History:  Diagnosis Date  . Diabetes mellitus without complication (HCC)   . Diabetic ulcer of foot with fat layer exposed (HCC) 10/23/2017   Review of Systems:  Per HPI  Physical Exam:  Vitals:   12/01/17 1313  BP: (!) 139/108  Pulse: (!) 106  Temp: 98.2 F (36.8 C)  TempSrc: Oral  SpO2: 99%  Height: 6' (1.829 m)   General: well-nourished, pleasant gentleman, NAD CV: RRR; no murmurs, rubs or gallops Pulm: normal respiratory effort; lungs CTA bilaterally; no wheezes or crackles Ext: distal pulses intact bilaterally; right foot in bandage with   Skin: warm and dry. No rashes   Assessment & Plan:   See Encounters Tab for problem based charting.  Patient seen with Dr. Oswaldo Done

## 2017-12-01 NOTE — Assessment & Plan Note (Addendum)
-   CBGs are not at goal, but have improved since increasing Novolin; currently ranging 160s-220 - we will increase to 20 units bid wc  - return to clinic in 2 months for review of CBGs and repeat A1C  - encouraged his continued diet management with carb counting

## 2017-12-01 NOTE — Assessment & Plan Note (Signed)
Per review of ortho notes, graft is healing well. Will likely need a second graft due to large wound size. He continues to be pain free and elevating leg as much as possible. He will continue following up with ortho. Reminded him to return if he develops fever, pain, swelling or increased drainage.

## 2017-12-01 NOTE — Patient Instructions (Signed)
[name],  It was a pleasure meeting you. Keep up the great work your diet and consistency in blood sugar checks. Your blood sugars have improved significantly! We will increase your Novolin to 20 units twice daily with meals.   We will plan on seeing you back in 2 months for an A1C check.   In the mean time, please call with any questions or concerns!

## 2017-12-02 NOTE — Progress Notes (Signed)
Internal Medicine Clinic Attending  I saw and evaluated the patient.  I personally confirmed the key portions of the history and exam documented by Dr. Bloomfield and I reviewed pertinent patient test results.  The assessment, diagnosis, and plan were formulated together and I agree with the documentation in the resident's note.  

## 2017-12-02 NOTE — Addendum Note (Signed)
Addended by: Erlinda Hong T on: 12/02/2017 08:36 AM   Modules accepted: Level of Service

## 2017-12-06 ENCOUNTER — Encounter (INDEPENDENT_AMBULATORY_CARE_PROVIDER_SITE_OTHER): Payer: Self-pay | Admitting: Orthopedic Surgery

## 2017-12-06 ENCOUNTER — Ambulatory Visit (INDEPENDENT_AMBULATORY_CARE_PROVIDER_SITE_OTHER): Payer: Self-pay | Admitting: Physician Assistant

## 2017-12-06 VITALS — Ht 72.0 in | Wt 220.0 lb

## 2017-12-06 DIAGNOSIS — E11621 Type 2 diabetes mellitus with foot ulcer: Secondary | ICD-10-CM

## 2017-12-06 DIAGNOSIS — E440 Moderate protein-calorie malnutrition: Secondary | ICD-10-CM

## 2017-12-06 DIAGNOSIS — Z945 Skin transplant status: Secondary | ICD-10-CM

## 2017-12-06 DIAGNOSIS — L97519 Non-pressure chronic ulcer of other part of right foot with unspecified severity: Secondary | ICD-10-CM

## 2017-12-06 DIAGNOSIS — R7309 Other abnormal glucose: Secondary | ICD-10-CM

## 2017-12-07 ENCOUNTER — Encounter (INDEPENDENT_AMBULATORY_CARE_PROVIDER_SITE_OTHER): Payer: Self-pay | Admitting: Physician Assistant

## 2017-12-07 NOTE — Progress Notes (Signed)
Office Visit Note   Patient: Jason Monroe           Date of Birth: 1970/12/18           MRN: [id number] Visit Date: 12/06/2017              Requested by: Lenward Chancellor D, DO [address] [place], [place] [address] PCP: Bridget Hartshorn, DO  Chief Complaint  Patient presents with  . Right Foot - Routine Post Op    10/27/17 right foot s/p I&D STSG      HPI: Patient is a 47 year old male who is seen for postoperative follow-up following debridement of his right diabetic foot ulcer and placement of split-thickness skin grafting on 10/27/2017.  He continues to wash the area and use Silvadene to the wound bed.  He is nonweightbearing utilizing a knee scooter or crutches.  Assessment & Plan: Visit Diagnoses:  1. Status post split thickness skin graft   2. Type 2 diabetes mellitus with right diabetic foot ulcer (HCC)   3. Moderate protein-calorie malnutrition (HCC)   4. Elevated hemoglobin A1c     Plan: Continue to wash the area in the shower with soap and water and apply Silvadene daily.  Continue nonweightbearing.  Follow-up in approximately 2 weeks or sooner should he have difficulties in the interim.  Follow-Up Instructions: Return in about 2 weeks (around 12/20/2017).   Ortho Exam  Patient is alert, oriented, no adenopathy, well-dressed, normal affect, normal respiratory effort. There is continued contraction of the wound over the plantar surface of the patient's right foot.  He continues to granulate and with decreased depth.  Overall dimensions are approximately 10.5 x 5 x 1.2 cm  Imaging: No results found.    Labs: Lab Results  Component Value Date   HGBA1C 10.7 (H) 10/23/2017   REPTSTATUS 10/29/2017 FINAL 10/24/2017   GRAMSTAIN  10/24/2017    FEW WBC PRESENT,BOTH PMN AND MONONUCLEAR RARE GRAM POSITIVE COCCI IN PAIRS RARE GRAM VARIABLE ROD    CULT  10/24/2017    FEW VIRIDANS STREPTOCOCCUS FEW BACTEROIDES ORALIS BETA LACTAMASE POSITIVE FEW GROUP B  STREP(S.AGALACTIAE)ISOLATED TESTING AGAINST S. AGALACTIAE NOT ROUTINELY PERFORMED DUE TO PREDICTABILITY OF AMP/PEN/VAN SUSCEPTIBILITY. Performed at [facility], [address] [place], [place] [address]      Lab Results  Component Value Date   ALBUMIN 2.9 (L) 10/23/2017    Body mass index is 29.84 kg/m.  Orders:  No orders of the defined types were placed in this encounter.  No orders of the defined types were placed in this encounter.    Procedures: No procedures performed  Clinical Data: No additional findings.  ROS:  All other systems negative, except as noted in the HPI. Review of Systems  Objective: Vital Signs: Ht 6' (1.829 m)   Wt 220 lb (99.8 kg)   BMI 29.84 kg/m   Specialty Comments:  No specialty comments available.  PMFS History: Patient Active Problem List   Diagnosis Date Noted  . Healthcare maintenance 11/02/2017  . Status post split thickness skin graft 11/02/2017  . 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 Type 2 diabetes mellitus with right diabetic foot ulcer (HCC)   . Moderate protein-calorie malnutrition (HCC)    Past Medical History:  Diagnosis Date  . Diabetes mellitus without complication (HCC)   . 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  Arrhythmia Sister   . Diabetes type I Maternal Grandmother     Past Surgical History:  Procedure Laterality Date  . I&D EXTREMITY Right 10/24/2017   Procedure: IRRIGATION AND DEBRIDEMENT OF FOOT;  Surgeon: Nadara Mustard, MD;  Location: [facility] OR;  Service: Orthopedics;  Laterality: Right;  . I&D EXTREMITY Right 10/27/2017   Procedure: DEBRIDEMENT AND SKIN GRAFT RIGHT FOOT;  Surgeon: Nadara Mustard, MD;  Location: [facility] OR;  Service: Orthopedics;  Laterality: Right;   Social History   Occupational History  . Not on file  Tobacco Use  . Smoking status: Former Smoker    Types: Pipe  .  Smokeless tobacco: Never Used  . Tobacco comment: quit 55yrs ago  Substance and Sexual Activity  . Alcohol use: Not Currently  . Drug use: No  . Sexual activity: Not on file

## 2017-12-09 ENCOUNTER — Ambulatory Visit: Payer: Self-pay

## 2017-12-20 ENCOUNTER — Encounter (INDEPENDENT_AMBULATORY_CARE_PROVIDER_SITE_OTHER): Payer: Self-pay | Admitting: Orthopedic Surgery

## 2017-12-20 ENCOUNTER — Ambulatory Visit (INDEPENDENT_AMBULATORY_CARE_PROVIDER_SITE_OTHER): Payer: Self-pay | Admitting: Orthopedic Surgery

## 2017-12-20 VITALS — Ht 72.0 in | Wt 220.0 lb

## 2017-12-20 DIAGNOSIS — Z945 Skin transplant status: Secondary | ICD-10-CM

## 2017-12-21 ENCOUNTER — Encounter (INDEPENDENT_AMBULATORY_CARE_PROVIDER_SITE_OTHER): Payer: Self-pay | Admitting: Orthopedic Surgery

## 2017-12-21 NOTE — Progress Notes (Signed)
Office Visit Note   Patient: Jason Monroe           Date of Birth: December 12, 1970           MRN: [id number] Visit Date: 12/20/2017              Requested by: Lenward Chancellor D, DO [address] [place], [place] [address] PCP: Bridget Hartshorn, DO  Chief Complaint  Patient presents with  . Right Foot - Routine Post Op    S/p I&D right foot; STSG      HPI: Patient is a 47 year old gentleman who presents in follow-up status post skin graft plantar aspect right foot.  Assessment & Plan: Visit Diagnoses:  1. Status post split thickness skin graft     Plan: Patient is showing excellent epithelialization.  He does have increased drainage we will start him on some silver alginate dressing changes daily he was given 5 packs of the silver alginate dressing.  Follow-Up Instructions: Return in about 2 weeks (around 01/03/2018).   Ortho Exam  Patient is alert, oriented, no adenopathy, well-dressed, normal affect, normal respiratory effort. Examination patient has excellent epithelialization around the wound edges the wound now measures 3 x 3 cm.  He does have increased drainage there is 90% granulation tissue.  We will start with silver cell dressing change.  Imaging: No results found.     Labs: Lab Results  Component Value Date   HGBA1C 10.7 (H) 10/23/2017   REPTSTATUS 10/29/2017 FINAL 10/24/2017   GRAMSTAIN  10/24/2017    FEW WBC PRESENT,BOTH PMN AND MONONUCLEAR RARE GRAM POSITIVE COCCI IN PAIRS RARE GRAM VARIABLE ROD    CULT  10/24/2017    FEW VIRIDANS STREPTOCOCCUS FEW BACTEROIDES ORALIS BETA LACTAMASE POSITIVE FEW GROUP B STREP(S.AGALACTIAE)ISOLATED TESTING AGAINST S. AGALACTIAE NOT ROUTINELY PERFORMED DUE TO PREDICTABILITY OF AMP/PEN/VAN SUSCEPTIBILITY. Performed at [facility], [address] [place], [place] [address]      Lab Results  Component Value Date   ALBUMIN 2.9 (L) 10/23/2017    Body mass index is 29.84 kg/m.  Orders:  No  orders of the defined types were placed in this encounter.  No orders of the defined types were placed in this encounter.    Procedures: No procedures performed  Clinical Data: No additional findings.  ROS:  All other systems negative, except as noted in the HPI. Review of Systems  Objective: Vital Signs: Ht 6' (1.829 m)   Wt 220 lb (99.8 kg)   BMI 29.84 kg/m   Specialty Comments:  No specialty comments available.  PMFS History: Patient Active Problem List   Diagnosis Date Noted  . Healthcare maintenance 11/02/2017  . Status post split thickness skin graft 11/02/2017  . 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 Type 2 diabetes mellitus with right diabetic foot ulcer (HCC)   . Moderate protein-calorie malnutrition (HCC)    Past Medical History:  Diagnosis Date  . Diabetes mellitus without complication (HCC)   . 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 Arrhythmia Sister   . Diabetes type I Maternal Grandmother     Past Surgical History:  Procedure Laterality Date  . I&D EXTREMITY Right 10/24/2017   Procedure: IRRIGATION AND DEBRIDEMENT OF FOOT;  Surgeon: Nadara Mustard, MD;  Location: [facility] OR;  Service: Orthopedics;  Laterality: Right;  . I&D EXTREMITY Right 10/27/2017  Procedure: DEBRIDEMENT AND SKIN GRAFT RIGHT FOOT;  Surgeon: Nadara Mustard, MD;  Location: [facility] OR;  Service: Orthopedics;  Laterality: Right;   Social History   Occupational History  . Not on file  Tobacco Use  . Smoking status: Former Smoker    Types: Pipe  . Smokeless tobacco: Never Used  . Tobacco comment: quit 2yrs ago  Substance and Sexual Activity  . Alcohol use: Not Currently  . Drug use: No  . Sexual activity: Not on file

## 2018-01-03 ENCOUNTER — Ambulatory Visit (INDEPENDENT_AMBULATORY_CARE_PROVIDER_SITE_OTHER): Payer: Self-pay | Admitting: Orthopedic Surgery

## 2018-01-03 ENCOUNTER — Encounter (INDEPENDENT_AMBULATORY_CARE_PROVIDER_SITE_OTHER): Payer: Self-pay | Admitting: Orthopedic Surgery

## 2018-01-03 VITALS — Ht 72.0 in | Wt 220.0 lb

## 2018-01-03 DIAGNOSIS — Z945 Skin transplant status: Secondary | ICD-10-CM

## 2018-01-03 NOTE — Progress Notes (Signed)
Office Visit Note   Patient: Jason Monroe           Date of Birth: 06-12-1970           MRN: [id number] Visit Date: 01/03/2018              Requested by: Lenward Chancellor D, DO [address] [place], [place] [address] PCP: Bridget Hartshorn, DO  Chief Complaint  Patient presents with  . Right Foot - Routine Post Op    10/27/17 right foot STSG      HPI: Patient is a 47 year old gentleman who presents status post foot salvage intervention with wound VAC and split-thickness skin graft plantar aspect of the right foot.  Patient is currently using silver cell nonweightbearing with a kneeling scooter.  Assessment & Plan: Visit Diagnoses:  1. Status post split thickness skin graft     Plan: Patient continues to show excellent epithelialization.  The wound bed is less than 10 mm in diameter.  There is good superficial epithelialization around the wound edges.  Continue with current care.  Follow-Up Instructions: Return in about 2 weeks (around 01/17/2018).   Ortho Exam  Patient is alert, oriented, no adenopathy, well-dressed, normal affect, normal respiratory effort. Examination the wound continues to show rapid healing.  The area of granulation tissue is less than 10 mm in diameter.  We will continue with the current care there is no redness no cellulitis no odor no drainage no signs of infection.  Imaging: No results found.   Labs: Lab Results  Component Value Date   HGBA1C 10.7 (H) 10/23/2017   REPTSTATUS 10/29/2017 FINAL 10/24/2017   GRAMSTAIN  10/24/2017    FEW WBC PRESENT,BOTH PMN AND MONONUCLEAR RARE GRAM POSITIVE COCCI IN PAIRS RARE GRAM VARIABLE ROD    CULT  10/24/2017    FEW VIRIDANS STREPTOCOCCUS FEW BACTEROIDES ORALIS BETA LACTAMASE POSITIVE FEW GROUP B STREP(S.AGALACTIAE)ISOLATED TESTING AGAINST S. AGALACTIAE NOT ROUTINELY PERFORMED DUE TO PREDICTABILITY OF AMP/PEN/VAN SUSCEPTIBILITY. Performed at [facility], [address]  [place], [place] [address]      Lab Results  Component Value Date   ALBUMIN 2.9 (L) 10/23/2017    Body mass index is 29.84 kg/m.  Orders:  No orders of the defined types were placed in this encounter.  No orders of the defined types were placed in this encounter.    Procedures: No procedures performed  Clinical Data: No additional findings.  ROS:  All other systems negative, except as noted in the HPI. Review of Systems  Objective: Vital Signs: Ht 6' (1.829 m)   Wt 220 lb (99.8 kg)   BMI 29.84 kg/m   Specialty Comments:  No specialty comments available.  PMFS History: Patient Active Problem List   Diagnosis Date Noted  . Healthcare maintenance 11/02/2017  . Status post split thickness skin graft 11/02/2017  . 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 Type 2 diabetes mellitus with right diabetic foot ulcer (HCC)   . Moderate protein-calorie malnutrition (HCC)    Past Medical History:  Diagnosis Date  . Diabetes mellitus without complication (HCC)   . 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 Arrhythmia Sister   . Diabetes type I Maternal Grandmother     Past Surgical History:  Procedure Laterality Date  . I&D EXTREMITY Right 10/24/2017   Procedure: IRRIGATION AND DEBRIDEMENT OF FOOT;  Surgeon: Nadara Mustard, MD;  Location: [facility] OR;  Service: Orthopedics;  Laterality: Right;  . I&D EXTREMITY Right 10/27/2017   Procedure: DEBRIDEMENT AND SKIN GRAFT RIGHT FOOT;  Surgeon: Nadara Mustard, MD;  Location: [facility] OR;  Service: Orthopedics;  Laterality: Right;   Social History   Occupational History  . Not on file  Tobacco Use  . Smoking status: Former Smoker    Types: Pipe  . Smokeless tobacco: Never Used  . Tobacco comment: quit 60yrs ago  Substance and Sexual Activity  . Alcohol use: Not Currently  . Drug use: No  . Sexual activity: Not on file

## 2018-01-17 ENCOUNTER — Encounter (INDEPENDENT_AMBULATORY_CARE_PROVIDER_SITE_OTHER): Payer: Self-pay | Admitting: Orthopedic Surgery

## 2018-01-17 ENCOUNTER — Ambulatory Visit (INDEPENDENT_AMBULATORY_CARE_PROVIDER_SITE_OTHER): Payer: Self-pay | Admitting: Orthopedic Surgery

## 2018-01-17 VITALS — Ht 72.0 in | Wt 220.0 lb

## 2018-01-17 DIAGNOSIS — E11621 Type 2 diabetes mellitus with foot ulcer: Secondary | ICD-10-CM

## 2018-01-17 DIAGNOSIS — L97519 Non-pressure chronic ulcer of other part of right foot with unspecified severity: Secondary | ICD-10-CM

## 2018-01-17 DIAGNOSIS — Z945 Skin transplant status: Secondary | ICD-10-CM

## 2018-01-17 NOTE — Progress Notes (Signed)
Office Visit Note   Patient: Jason Monroe           Date of Birth: 04/18/1970           MRN: [id number] Visit Date: 01/17/2018              Requested by: Lenward ChancellorBloomfield, Carley D, DO [address] [place], [place] [address] PCP: Bridget HartshornBloomfield, Carley D, DO  Chief Complaint  Patient presents with  . Right Foot - Routine Post Op    S/p STSG right foot      HPI: Patient presents status post split-thickness skin graft for a large infected wound plantar aspect of the right foot.  Patient states there is been no drainage he is pleased with his progress he still using a kneeling scooter.  Assessment & Plan: Visit Diagnoses:  1. Status post split thickness skin graft   2. Type 2 diabetes mellitus with right diabetic foot ulcer (HCC)     Plan: Patient has made excellent progress we will give him a sample of the Prisma he will change this every 3 days.  Continue with a kneeling scooter weightbearing as tolerated on the heel.  Follow-Up Instructions: Return in about 2 weeks (around 01/31/2018).   Ortho Exam  Patient is alert, oriented, no adenopathy, well-dressed, normal affect, normal respiratory effort. Examination patient has a large scab that is removed there is a small area of flat granulation tissue in the mid aspect of the wound there is some clear serosanguineous drainage but no depth to the wound no signs of infection the wound is 5 x 15 mm and 1 mm deep.  There is no surrounding cellulitis there is no tenderness to palpation.  Imaging: No results found. No images are attached to the encounter.  Labs: Lab Results  Component Value Date   HGBA1C 10.7 (H) 10/23/2017   REPTSTATUS 10/29/2017 FINAL 10/24/2017   GRAMSTAIN  10/24/2017    FEW WBC PRESENT,BOTH PMN AND MONONUCLEAR RARE GRAM POSITIVE COCCI IN PAIRS RARE GRAM VARIABLE ROD    CULT  10/24/2017    FEW VIRIDANS STREPTOCOCCUS FEW BACTEROIDES ORALIS BETA LACTAMASE POSITIVE FEW GROUP B  STREP(S.AGALACTIAE)ISOLATED TESTING AGAINST S. AGALACTIAE NOT ROUTINELY PERFORMED DUE TO PREDICTABILITY OF AMP/PEN/VAN SUSCEPTIBILITY. Performed at [facility], [address] [place], [place] [address]      Lab Results  Component Value Date   ALBUMIN 2.9 (L) 10/23/2017    Body mass index is 29.84 kg/m.  Orders:  No orders of the defined types were placed in this encounter.  No orders of the defined types were placed in this encounter.    Procedures: No procedures performed  Clinical Data: No additional findings.  ROS:  All other systems negative, except as noted in the HPI. Review of Systems  Objective: Vital Signs: Ht 6' (1.829 m)   Wt 220 lb (99.8 kg)   BMI 29.84 kg/m   Specialty Comments:  No specialty comments available.  PMFS History: Patient Active Problem List   Diagnosis Date Noted  . Healthcare maintenance 11/02/2017  . Status post split thickness skin graft 11/02/2017  . 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 Type 2 diabetes mellitus with right diabetic foot ulcer (HCC)   . Moderate protein-calorie malnutrition (HCC)    Past Medical History:  Diagnosis Date  . Diabetes mellitus without complication (HCC)   . Diabetic ulcer of foot with fat layer exposed (HCC) 10/23/2017    Family History  Problem  Relation Age of Onset  . Arrhythmia Sister   . Diabetes type I Maternal Grandmother     Past Surgical History:  Procedure Laterality Date  . I&D EXTREMITY Right 10/24/2017   Procedure: IRRIGATION AND DEBRIDEMENT OF FOOT;  Surgeon: Nadara Mustard, MD;  Location: [facility] OR;  Service: Orthopedics;  Laterality: Right;  . I&D EXTREMITY Right 10/27/2017   Procedure: DEBRIDEMENT AND SKIN GRAFT RIGHT FOOT;  Surgeon: Nadara Mustard, MD;  Location: [facility] OR;  Service: Orthopedics;  Laterality: Right;   Social History   Occupational History  . Not on file  Tobacco Use  . Smoking status: Former Smoker    Types: Pipe  .  Smokeless tobacco: Never Used  . Tobacco comment: quit 46yrs ago  Substance and Sexual Activity  . Alcohol use: Not Currently  . Drug use: No  . Sexual activity: Not on file

## 2018-01-24 ENCOUNTER — Ambulatory Visit (INDEPENDENT_AMBULATORY_CARE_PROVIDER_SITE_OTHER): Payer: Self-pay | Admitting: Internal Medicine

## 2018-01-24 ENCOUNTER — Encounter (INDEPENDENT_AMBULATORY_CARE_PROVIDER_SITE_OTHER): Payer: Self-pay

## 2018-01-24 ENCOUNTER — Encounter: Payer: Self-pay | Admitting: Internal Medicine

## 2018-01-24 VITALS — BP 135/90 | HR 89 | Temp 98.4°F

## 2018-01-24 DIAGNOSIS — L97519 Non-pressure chronic ulcer of other part of right foot with unspecified severity: Secondary | ICD-10-CM

## 2018-01-24 DIAGNOSIS — L97512 Non-pressure chronic ulcer of other part of right foot with fat layer exposed: Secondary | ICD-10-CM

## 2018-01-24 DIAGNOSIS — E11621 Type 2 diabetes mellitus with foot ulcer: Secondary | ICD-10-CM

## 2018-01-24 DIAGNOSIS — Z794 Long term (current) use of insulin: Secondary | ICD-10-CM

## 2018-01-24 DIAGNOSIS — L97412 Non-pressure chronic ulcer of right heel and midfoot with fat layer exposed: Secondary | ICD-10-CM

## 2018-01-24 LAB — POCT GLYCOSYLATED HEMOGLOBIN (HGB A1C): Hemoglobin A1C: 6.9 % — AB (ref 4.0–5.6)

## 2018-01-24 LAB — GLUCOSE, CAPILLARY: Glucose-Capillary: 168 mg/dL — ABNORMAL HIGH (ref 70–99)

## 2018-01-24 MED ORDER — INSULIN NPH ISOPHANE & REGULAR (70-30) 100 UNIT/ML ~~LOC~~ SUSP: SUBCUTANEOUS | 3 refills | Status: DC

## 2018-01-24 NOTE — Progress Notes (Signed)
   CC: DM  HPI:  [name] is a 47 y.o. gentleman with PMHx DM complicated by diabetic foot ulcer s/p split thickness skin graft who presents for follow-up on DM. Patient's Novolin 70/30 was increased from 18 to 20 units BID at prior visit on 10/02.Tolerated increase well. CBGs now ranging mostly in 130s-160s with occasional readings in the low 200s that occur in the evenings before bed. He denies any lightheadedness, chest pain, shortness of breath, polyuria, polydipsia, blurred vision, abdominal pain, n/v. No hypoglycemic events.  Patient is followed by Dr. Lajoyce Cornersuda for right diabetic foot ulcer that is s/p skin graft. It is healing very well and may be weightbearing within the next month or so. He looks forward to being able to be more active. No fevers, chills, drainage, increased pain, swelling or erythema.    Past Medical History:  Diagnosis Date  . Diabetes mellitus without complication (HCC)   . 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  Psych: appropriate mood and affect   Assessment & Plan:   See Encounters Tab for problem based charting.  Patient seen with Dr. Cleda DaubE. Hoffman

## 2018-01-24 NOTE — Patient Instructions (Signed)
Jason Monroe, It was a pleasure seeing you today! Congratulations on the great improvement in your A1C down to 6.9! Keep up the great work!  I am increasing your morning time Novolin dose to 22 units; keep your evening dose the same at 20 units.  We will plan to see you back in 3 months for another A1C check. We will also check your cholesterol at this time.  We discussed periodically checking your blood pressure at home and keeping it with your blood sugar log.   Hope you have a wonderful holiday season, and I'll see you after the New Year!  Take care, Dr. Chesley MiresBloomfield

## 2018-01-25 ENCOUNTER — Encounter: Payer: Self-pay | Admitting: Internal Medicine

## 2018-01-25 NOTE — Assessment & Plan Note (Signed)
Patient's A1C improved from 10.7>6.9 Patient is compliant with Novolin and doing very well with diet modifications. Given that his CBGs qhs are more elevated, will increase Novolin am dose from 20 to 22 units. Continue evening dose of 20 units.  Patient is currently self-pay. Hopes to have benefits by follow-up visit in 3 months. At this time will order lipid panel, urine microalbumin, and diabetic eye exam.  He would also be a good candidate for new diabetic agents like SGLT2I or GLP1.

## 2018-01-25 NOTE — Assessment & Plan Note (Signed)
Patient is followed closely by Dr. Lajoyce Cornersuda every 2 weeks. Skin graft is healing well per review of Dr. Audrie Liauda's notes. If there is any indication of delayed healing, would consider obtaining ABIs to assess for PVD.

## 2018-01-26 NOTE — Progress Notes (Signed)
Internal Medicine Clinic Attending  I saw and evaluated the patient.  I personally confirmed the key portions of the history and exam documented by Dr. Bloomfield and I reviewed pertinent patient test results.  The assessment, diagnosis, and plan were formulated together and I agree with the documentation in the resident's note.  

## 2018-02-03 ENCOUNTER — Ambulatory Visit (INDEPENDENT_AMBULATORY_CARE_PROVIDER_SITE_OTHER): Payer: Self-pay | Admitting: Physician Assistant

## 2018-02-03 ENCOUNTER — Encounter (INDEPENDENT_AMBULATORY_CARE_PROVIDER_SITE_OTHER): Payer: Self-pay | Admitting: Physician Assistant

## 2018-02-03 VITALS — Ht 72.0 in | Wt 220.0 lb

## 2018-02-03 DIAGNOSIS — L97519 Non-pressure chronic ulcer of other part of right foot with unspecified severity: Secondary | ICD-10-CM

## 2018-02-03 DIAGNOSIS — Z945 Skin transplant status: Secondary | ICD-10-CM

## 2018-02-03 DIAGNOSIS — E11621 Type 2 diabetes mellitus with foot ulcer: Secondary | ICD-10-CM

## 2018-02-03 DIAGNOSIS — E440 Moderate protein-calorie malnutrition: Secondary | ICD-10-CM

## 2018-02-04 ENCOUNTER — Encounter (INDEPENDENT_AMBULATORY_CARE_PROVIDER_SITE_OTHER): Payer: Self-pay | Admitting: Physician Assistant

## 2018-02-04 NOTE — Progress Notes (Signed)
Office Visit Note   Patient: Jason Monroe           Date of Birth: [birth year]           MRN: [id number] Visit Date: 02/03/2018              Requested by: Lenward Chancellor D, DO [address] [place], [place] [address] PCP: Bridget Hartshorn, DO  Chief Complaint  Patient presents with  . Right Foot - Follow-up    Deb and STSG      HPI: The patient is a 47 yo gentleman here with his mom for follow up of his right plantar diabetic foot ulcer status post split thickness skin grafting to the right foot 10/27/17. He reports his Hgb A1C is now 6.9. He is very pleased with the result of the graft. He has been using a knee scooter to off load the foot the majority of the time. He is using a whey protein supplement.    Assessment & Plan: Visit Diagnoses:  1. Status post split thickness skin graft   2. Type 2 diabetes mellitus with right diabetic foot ulcer (HCC)   3. Moderate protein-calorie malnutrition (HCC)     Plan: Counseled patient he can begin weight bearing in a stiff soled sneaker or post op shoe and we also provided a felt pad to try to off load the grafted area. We will reassess how the patient is tolerating ambulation with off loading felt pad and consider for custom inserts if needed . Lactic acid /AHA lotion to help with dry skin of feet. Follow up in 4 weeks.   Follow-Up Instructions: Return in about 4 weeks (around 03/03/2018).   Ortho Exam  Patient is alert, oriented, no adenopathy, well-dressed, normal affect, normal respiratory effort. The right foot plantar graft has healed. There is crusting of the proximal graft site. No signs of infection or cellulitis. Some purple/gray discoloration of grafted site. Good pedal pulses. Xerosis of foot and we discussed using a lactic acid lotion to help with this.   Imaging: No results found.   Labs: Lab Results  Component Value Date   HGBA1C 6.9 (A) 01/24/2018   HGBA1C 10.7 (H) 10/23/2017   REPTSTATUS 10/29/2017 FINAL  10/24/2017   GRAMSTAIN  10/24/2017    FEW WBC PRESENT,BOTH PMN AND MONONUCLEAR RARE GRAM POSITIVE COCCI IN PAIRS RARE GRAM VARIABLE ROD    CULT  10/24/2017    FEW VIRIDANS STREPTOCOCCUS FEW BACTEROIDES ORALIS BETA LACTAMASE POSITIVE FEW GROUP B STREP(S.AGALACTIAE)ISOLATED TESTING AGAINST S. AGALACTIAE NOT ROUTINELY PERFORMED DUE TO PREDICTABILITY OF AMP/PEN/VAN SUSCEPTIBILITY. Performed at [facility], [address] [place], [place] [address]      Lab Results  Component Value Date   ALBUMIN 2.9 (L) 10/23/2017    Body mass index is 29.84 kg/m.  Orders:  No orders of the defined types were placed in this encounter.  No orders of the defined types were placed in this encounter.    Procedures: No procedures performed  Clinical Data: No additional findings.  ROS:  All other systems negative, except as noted in the HPI. Review of Systems  Objective: Vital Signs: Ht 6' (1.829 m)   Wt 220 lb (99.8 kg)   BMI 29.84 kg/m   Specialty Comments:  No specialty comments available.  PMFS History: Patient Active Problem List   Diagnosis Date Noted  . Healthcare maintenance 11/02/2017  . Status post split thickness skin graft 11/02/2017  . Diabetic ulcer of foot  with fat layer exposed (HCC) 10/23/2017  . Type 2 diabetes mellitus with right diabetic foot ulcer (HCC)   . Moderate protein-calorie malnutrition (HCC)    Past Medical History:  Diagnosis Date  . Diabetes mellitus without complication (HCC)   . 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 Arrhythmia Sister   . Diabetes type I Maternal Grandmother     Past Surgical History:  Procedure Laterality Date  . I&D EXTREMITY Right 10/24/2017   Procedure: IRRIGATION AND DEBRIDEMENT OF FOOT;  Surgeon: Nadara Mustarduda, Marcus V, MD;  [facility];  Service: Orthopedics;  Laterality: Right;  . I&D EXTREMITY Right 10/27/2017   Procedure: DEBRIDEMENT  AND SKIN GRAFT RIGHT FOOT;  Surgeon: Nadara Mustarduda, Marcus V, MD;  [facility];  Service: Orthopedics;  Laterality: Right;   Social History   Occupational History  . Not on file  Tobacco Use  . Smoking status: Former Smoker    Types: Pipe  . Smokeless tobacco: Never Used  . Tobacco comment: quit 293yrs ago  Substance and Sexual Activity  . Alcohol use: Not Currently  . Drug use: No  . Sexual activity: Not on file

## 2018-03-01 ENCOUNTER — Telehealth (INDEPENDENT_AMBULATORY_CARE_PROVIDER_SITE_OTHER): Payer: Self-pay | Admitting: Physician Assistant

## 2018-03-01 NOTE — Telephone Encounter (Signed)
02/03/18 ov note fax[facility] to [facility] [phone], ph [phone]

## 2018-03-03 ENCOUNTER — Ambulatory Visit (INDEPENDENT_AMBULATORY_CARE_PROVIDER_SITE_OTHER): Payer: Self-pay | Admitting: Physician Assistant

## 2018-03-03 DIAGNOSIS — E440 Moderate protein-calorie malnutrition: Secondary | ICD-10-CM

## 2018-03-03 DIAGNOSIS — E11621 Type 2 diabetes mellitus with foot ulcer: Secondary | ICD-10-CM

## 2018-03-03 DIAGNOSIS — Z945 Skin transplant status: Secondary | ICD-10-CM

## 2018-03-03 DIAGNOSIS — L97519 Non-pressure chronic ulcer of other part of right foot with unspecified severity: Secondary | ICD-10-CM

## 2018-03-04 ENCOUNTER — Encounter (INDEPENDENT_AMBULATORY_CARE_PROVIDER_SITE_OTHER): Payer: Self-pay | Admitting: Physician Assistant

## 2018-03-04 NOTE — Progress Notes (Signed)
Office Visit Note   Patient: Jason Monroe           Date of Birth: 10/31/1970           MRN: [id number] Visit Date: 03/03/2018              Requested by: Lenward ChancellorBloomfield, Carley D, DO [address] [place], [place] [address] PCP: Bridget HartshornBloomfield, Carley D, DO  Chief Complaint  Patient presents with  . Right Foot - Follow-up      HPI: The patient is a 48 year old gentleman who is seen for postoperative follow-up of his right plantar diabetic foot ulcer status post split thickness skin grafting on 10/27/2017.  He has started walking with a postop shoe with felt padding to avoid pressure over the grafted area.  He does note some continued small drainage of a yellowish clear fluid and intermittent slough of some skin from the area.  He has no pain over the foot.  He continues on whey protein supplementation and vitamins and probiotics.  Assessment & Plan: Visit Diagnoses:  1. Status post split thickness skin graft   2. Type 2 diabetes mellitus with right diabetic foot ulcer (HCC)   3. Moderate protein-calorie malnutrition (HCC)     Plan: We will have the patient continue to gradually weight-bear over the right foot skin graft with a cane.  He does have felt to offload the area.  Will send him for custom orthotic once he is no longer having any drainage or signs of any breakdown.  He will follow-up here in 3 weeks.  Follow-Up Instructions: Return in about 3 weeks (around 03/24/2018).   Ortho Exam  Patient is alert, oriented, no adenopathy, well-dressed, normal affect, normal respiratory effort. There is continued maturation of the right foot graft.  There is some old drainage on his sock but no drainage today is observed.  He does have a crusted area which sloughs off easily and reveals a purple hyperpigmented scar where the graft healed.  There are no signs of cellulitis or infection.  He has good pedal pulses.  Imaging: No results found.   Labs: Lab Results  Component Value Date   HGBA1C 6.9 (A) 01/24/2018   HGBA1C 10.7 (H) 10/23/2017   REPTSTATUS 10/29/2017 FINAL 10/24/2017   GRAMSTAIN  10/24/2017    FEW WBC PRESENT,BOTH PMN AND MONONUCLEAR RARE GRAM POSITIVE COCCI IN PAIRS RARE GRAM VARIABLE ROD    CULT  10/24/2017    FEW VIRIDANS STREPTOCOCCUS FEW BACTEROIDES ORALIS BETA LACTAMASE POSITIVE FEW GROUP B STREP(S.AGALACTIAE)ISOLATED TESTING AGAINST S. AGALACTIAE NOT ROUTINELY PERFORMED DUE TO PREDICTABILITY OF AMP/PEN/VAN SUSCEPTIBILITY. Performed at [facility], [address] [place], [place] [address]      Lab Results  Component Value Date   ALBUMIN 2.9 (L) 10/23/2017    There is no height or weight on file to calculate BMI.  Orders:  No orders of the defined types were placed in this encounter.  No orders of the defined types were placed in this encounter.    Procedures: No procedures performed  Clinical Data: No additional findings.  ROS:  All other systems negative, except as noted in the HPI. Review of Systems  Objective: Vital Signs: There were no vitals taken for this visit.  Specialty Comments:  No specialty comments available.  PMFS History: Patient Active Problem List   Diagnosis Date Noted  . Healthcare maintenance 11/02/2017  . Status post split thickness skin graft 11/02/2017  . Diabetic ulcer of foot with  fat layer exposed (HCC) 10/23/2017  . Type 2 diabetes mellitus with right diabetic foot ulcer (HCC)   . Moderate protein-calorie malnutrition (HCC)    Past Medical History:  Diagnosis Date  . Diabetes mellitus without complication (HCC)   . 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 Arrhythmia Sister   . Diabetes type I Maternal Grandmother     Past Surgical History:  Procedure Laterality Date  . I&D EXTREMITY Right 10/24/2017   Procedure: IRRIGATION AND DEBRIDEMENT OF FOOT;  Surgeon: Nadara Mustard, MD;  Location: [facility] OR;   Service: Orthopedics;  Laterality: Right;  . I&D EXTREMITY Right 10/27/2017   Procedure: DEBRIDEMENT AND SKIN GRAFT RIGHT FOOT;  Surgeon: Nadara Mustard, MD;  [facility] OR;  Service: Orthopedics;  Laterality: Right;   Social History   Occupational History  . Not on file  Tobacco Use  . Smoking status: Former Smoker    Types: Pipe  . Smokeless tobacco: Never Used  . Tobacco comment: quit 73yrs ago  Substance and Sexual Activity  . Alcohol use: Not Currently  . Drug use: No  . Sexual activity: Not on file

## 2018-03-21 ENCOUNTER — Encounter (INDEPENDENT_AMBULATORY_CARE_PROVIDER_SITE_OTHER): Payer: Self-pay | Admitting: Physician Assistant

## 2018-03-21 ENCOUNTER — Ambulatory Visit (INDEPENDENT_AMBULATORY_CARE_PROVIDER_SITE_OTHER): Payer: Self-pay | Admitting: Physician Assistant

## 2018-03-21 DIAGNOSIS — Z945 Skin transplant status: Secondary | ICD-10-CM

## 2018-03-21 DIAGNOSIS — E440 Moderate protein-calorie malnutrition: Secondary | ICD-10-CM

## 2018-03-21 DIAGNOSIS — E11621 Type 2 diabetes mellitus with foot ulcer: Secondary | ICD-10-CM

## 2018-03-21 DIAGNOSIS — L97519 Non-pressure chronic ulcer of other part of right foot with unspecified severity: Secondary | ICD-10-CM

## 2018-03-21 NOTE — Progress Notes (Signed)
Office Visit Note   Patient: Jason Monroe           Date of Birth: 08/09/1970           MRN: [id number] Visit Date: 03/21/2018              Requested by: Lenward Chancellor D, DO [address] [place], [place] [address] PCP: Lenward Chancellor D, DO  No chief complaint on file.     HPI: The patient is a 48 year old gentleman who is seen for follow-up of a split thickness skin graft over the plantar surface of his right foot on 10/27/2017.  He has been able to completely heal the area and has been ambulating with a postop shoe with felt padding to avoid any pressure over the grafted area.  Over the past several weeks there is been no drainage from the grafted area.  They have been utilizing some AmLactin lotion to the foot and this is helping with his dry skin issues.  He is wanting to try to get back into a regular shoe and resume working.  Assessment & Plan: Visit Diagnoses:  1. Status post split thickness skin graft   2. Type 2 diabetes mellitus with right diabetic foot ulcer (HCC)   3. Moderate protein-calorie malnutrition (HCC)     Plan: I recommended Vive compression sock for the patient to wear at all times when he is up and about.  At this point he can a try to let him go into a regular shoe without an insert due to concerns over cost.  We will see how he does over the next several weeks.  If his graft continues to hold up well he may be able to continue with regular shoes without inserts.  He will follow-up in 3 weeks.  Follow-Up Instructions: Return in about 3 weeks (around 04/11/2018).   Ortho Exam  Patient is alert, oriented, no adenopathy, well-dressed, normal affect, normal respiratory effort. The right foot plantar surface graft continues to mature and improve.  He has only some very mild dried crusted areas but no drainage no signs of infection.  He has good palpable pedal pulses.  He has good range of motion.    Imaging: No results found. No images are  attached to the encounter.  Labs: Lab Results  Component Value Date   HGBA1C 6.9 (A) 01/24/2018   HGBA1C 10.7 (H) 10/23/2017   REPTSTATUS 10/29/2017 FINAL 10/24/2017   GRAMSTAIN  10/24/2017    FEW WBC PRESENT,BOTH PMN AND MONONUCLEAR RARE GRAM POSITIVE COCCI IN PAIRS RARE GRAM VARIABLE ROD    CULT  10/24/2017    FEW VIRIDANS STREPTOCOCCUS FEW BACTEROIDES ORALIS BETA LACTAMASE POSITIVE FEW GROUP B STREP(S.AGALACTIAE)ISOLATED TESTING AGAINST S. AGALACTIAE NOT ROUTINELY PERFORMED DUE TO PREDICTABILITY OF AMP/PEN/VAN SUSCEPTIBILITY. Performed at [facility], [address], [place], [place] [address]      Lab Results  Component Value Date   ALBUMIN 2.9 (L) 10/23/2017    There is no height or weight on file to calculate BMI.  Orders:  No orders of the defined types were placed in this encounter.  No orders of the defined types were placed in this encounter.    Procedures: No procedures performed  Clinical Data: No additional findings.  ROS:  All other systems negative, except as noted in the HPI. Review of Systems  Objective: Vital Signs: There were no vitals taken for this visit.  Specialty Comments:  No specialty comments available.  PMFS History:  Patient Active Problem List   Diagnosis Date Noted  . Healthcare maintenance 11/02/2017  . Status post split thickness skin graft 11/02/2017  . 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 Type 2 diabetes mellitus with right diabetic foot ulcer (HCC)   . Moderate protein-calorie malnutrition (HCC)    Past Medical History:  Diagnosis Date  . Diabetes mellitus without complication (HCC)   . 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 Arrhythmia Sister   . Diabetes type I Maternal Grandmother     Past Surgical History:  Procedure Laterality Date  . I&D EXTREMITY Right 10/24/2017   Procedure: IRRIGATION AND  DEBRIDEMENT OF FOOT;  Surgeon: Nadara Mustard, MD;  Location: [facility] OR;  Service: Orthopedics;  Laterality: Right;  . I&D EXTREMITY Right 10/27/2017   Procedure: DEBRIDEMENT AND SKIN GRAFT RIGHT FOOT;  Surgeon: Nadara Mustard, MD;  Location: [facility] OR;  Service: Orthopedics;  Laterality: Right;   Social History   Occupational History  . Not on file  Tobacco Use  . Smoking status: Former Smoker    Types: Pipe  . Smokeless tobacco: Never Used  . Tobacco comment: quit 19yrs ago  Substance and Sexual Activity  . Alcohol use: Not Currently  . Drug use: No  . Sexual activity: Not on file

## 2018-04-11 ENCOUNTER — Ambulatory Visit (INDEPENDENT_AMBULATORY_CARE_PROVIDER_SITE_OTHER): Payer: Self-pay | Admitting: Physician Assistant

## 2018-04-18 ENCOUNTER — Ambulatory Visit (INDEPENDENT_AMBULATORY_CARE_PROVIDER_SITE_OTHER): Payer: Self-pay | Admitting: Physician Assistant

## 2018-04-18 ENCOUNTER — Encounter (INDEPENDENT_AMBULATORY_CARE_PROVIDER_SITE_OTHER): Payer: Self-pay | Admitting: Physician Assistant

## 2018-04-18 VITALS — Ht 72.0 in | Wt 220.0 lb

## 2018-04-18 DIAGNOSIS — L97519 Non-pressure chronic ulcer of other part of right foot with unspecified severity: Secondary | ICD-10-CM

## 2018-04-18 DIAGNOSIS — E11621 Type 2 diabetes mellitus with foot ulcer: Secondary | ICD-10-CM

## 2018-04-18 DIAGNOSIS — Z945 Skin transplant status: Secondary | ICD-10-CM

## 2018-04-18 NOTE — Progress Notes (Signed)
Office Visit Note   Patient: Jason Monroe           Date of Birth: 04/04/1970           MRN: [id number] Visit Date: 04/18/2018              Requested by: Lenward Chancellor D, DO [address] [place], [place] [address] PCP: Bridget Hartshorn, DO  Chief Complaint  Patient presents with  . Right Foot - Follow-up      HPI: The patient is a 48 year old gentleman who is seen for postoperative follow-up following a split thickness skin graft, allograft over the plantar surface of his right foot and 09/2017.  He has been ambulating in a regular shoe and did obtain a Vive silver compression stocking.  He has not yet been wearing the stocking.  He does ambulate with a cane.  He continues to work on diabetic control and his last hemoglobin A1c was improved at 6.9.  Assessment & Plan: Visit Diagnoses:  1. Status post split thickness skin graft   2. Type 2 diabetes mellitus with right diabetic foot ulcer (HCC)     Plan: Instructed the patient he may begin using the Vive silver compression stocking except for showering on the right foot.  He can continue to progress with his ambulation with a regular shoe.  Instructed the patient to follow-up here for any signs of callus or ulceration formation or any other concerns with his feet.  He is established a primary care provider and will continue to follow with them.  He will follow-up here on an as-needed basis.  Follow-Up Instructions: Return if symptoms worsen or fail to improve.   Ortho Exam  Patient is alert, oriented, no adenopathy, well-dressed, normal affect, normal respiratory effort. The right foot graft is well-healed and there is no sign of breakdown and minimal crusting over the area.  There are no pressure areas.  He has no pain over the area but does have some mild tenderness and swelling after he is been up for longer periods.  He was instructed to do Achilles stretching.  He has palpable pedal pulses.  Imaging: No results  found.   Labs: Lab Results  Component Value Date   HGBA1C 6.9 (A) 01/24/2018   HGBA1C 10.7 (H) 10/23/2017   REPTSTATUS 10/29/2017 FINAL 10/24/2017   GRAMSTAIN  10/24/2017    FEW WBC PRESENT,BOTH PMN AND MONONUCLEAR RARE GRAM POSITIVE COCCI IN PAIRS RARE GRAM VARIABLE ROD    CULT  10/24/2017    FEW VIRIDANS STREPTOCOCCUS FEW BACTEROIDES ORALIS BETA LACTAMASE POSITIVE FEW GROUP B STREP(S.AGALACTIAE)ISOLATED TESTING AGAINST S. AGALACTIAE NOT ROUTINELY PERFORMED DUE TO PREDICTABILITY OF AMP/PEN/VAN SUSCEPTIBILITY. Performed at [facility], [address], [place], [place] [address]      Lab Results  Component Value Date   ALBUMIN 2.9 (L) 10/23/2017    Body mass index is 29.84 kg/m.  Orders:  No orders of the defined types were placed in this encounter.  No orders of the defined types were placed in this encounter.    Procedures: No procedures performed  Clinical Data: No additional findings.  ROS:  All other systems negative, except as noted in the HPI. Review of Systems  Objective: Vital Signs: Ht 6' (1.829 m)   Wt 220 lb (99.8 kg)   BMI 29.84 kg/m   Specialty Comments:  No specialty comments available.  PMFS History: Patient Active Problem List   Diagnosis Date Noted  . Healthcare maintenance  11/02/2017  . Status post split thickness skin graft 11/02/2017  . 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 Type 2 diabetes mellitus with right diabetic foot ulcer (HCC)   . Moderate protein-calorie malnutrition (HCC)    Past Medical History:  Diagnosis Date  . Diabetes mellitus without complication (HCC)   . 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 Arrhythmia Sister   . Diabetes type I Maternal Grandmother     Past Surgical History:  Procedure Laterality Date  . I&D EXTREMITY Right 10/24/2017   Procedure: IRRIGATION AND DEBRIDEMENT OF FOOT;   Surgeon: Nadara Mustard, MD;  Location: [facility] OR;  Service: Orthopedics;  Laterality: Right;  . I&D EXTREMITY Right 10/27/2017   Procedure: DEBRIDEMENT AND SKIN GRAFT RIGHT FOOT;  Surgeon: Nadara Mustard, MD;  Location: [facility] OR;  Service: Orthopedics;  Laterality: Right;   Social History   Occupational History  . Not on file  Tobacco Use  . Smoking status: Former Smoker    Types: Pipe  . Smokeless tobacco: Never Used  . Tobacco comment: quit 67yrs ago  Substance and Sexual Activity  . Alcohol use: Not Currently  . Drug use: No  . Sexual activity: Not on file

## 2018-04-25 ENCOUNTER — Encounter: Payer: Self-pay | Admitting: Internal Medicine

## 2018-04-25 ENCOUNTER — Ambulatory Visit (INDEPENDENT_AMBULATORY_CARE_PROVIDER_SITE_OTHER): Payer: Self-pay | Admitting: Internal Medicine

## 2018-04-25 VITALS — BP 152/104 | HR 108 | Wt 273.5 lb

## 2018-04-25 DIAGNOSIS — Z794 Long term (current) use of insulin: Secondary | ICD-10-CM

## 2018-04-25 DIAGNOSIS — E1142 Type 2 diabetes mellitus with diabetic polyneuropathy: Secondary | ICD-10-CM | POA: Insufficient documentation

## 2018-04-25 DIAGNOSIS — Z79899 Other long term (current) drug therapy: Secondary | ICD-10-CM

## 2018-04-25 DIAGNOSIS — Z23 Encounter for immunization: Secondary | ICD-10-CM

## 2018-04-25 DIAGNOSIS — L97519 Non-pressure chronic ulcer of other part of right foot with unspecified severity: Secondary | ICD-10-CM

## 2018-04-25 DIAGNOSIS — E11621 Type 2 diabetes mellitus with foot ulcer: Secondary | ICD-10-CM

## 2018-04-25 DIAGNOSIS — I10 Essential (primary) hypertension: Secondary | ICD-10-CM | POA: Insufficient documentation

## 2018-04-25 LAB — POCT GLYCOSYLATED HEMOGLOBIN (HGB A1C): HEMOGLOBIN A1C: 6.9 % — AB (ref 4.0–5.6)

## 2018-04-25 LAB — GLUCOSE, CAPILLARY: Glucose-Capillary: 153 mg/dL — ABNORMAL HIGH (ref 70–99)

## 2018-04-25 MED ORDER — LISINOPRIL 10 MG PO TABS: 10.0000 mg | ORAL_TABLET | Freq: Every day | ORAL | 0 refills | Status: DC

## 2018-04-25 MED ORDER — GABAPENTIN 300 MG PO CAPS: 300.0000 mg | ORAL_CAPSULE | Freq: Three times a day (TID) | ORAL | 2 refills | Status: DC

## 2018-04-25 NOTE — Progress Notes (Signed)
   CC: DM  HPI:  Mr.Malacki AMAURY MASCIOLI is a 48 y.o. gentleman with history listed below who presents for follow-up on his DM.  Last visit his am dose of Novolin was increased due to to more elevated CBGs qhs. He has tolerated this increase well. No hypoglycemic events over the last 3 months.  He noted some increased blood sugars recently in the setting of viral URI. He has been consistent with low-carb diet.  He looks forward to being more active now that he is able to walk after prolonged immobilization s/p right foot skin graft.   Past Medical History:  Diagnosis Date  . Diabetes mellitus without complication (HCC)   . 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  Eyes: Negative for blurred vision and double vision.  Respiratory: Negative for cough and shortness of breath.   Cardiovascular: Negative for chest pain, palpitations and claudication.  Gastrointestinal: Negative for abdominal pain, diarrhea, nausea and vomiting.  Genitourinary: Negative for frequency.  Neurological: Positive for tingling. Negative for dizziness, focal weakness and headaches.  Endo/Heme/Allergies: Negative for polydipsia.    Physical Exam:  Vitals:   04/25/18 1350 04/25/18 1429  BP: (!) 157/96 (!) 152/104  Pulse: 96 (!) 108  Weight: 273 lb 8 oz (124.1 kg)    Physical Exam Constitutional:      General: He is not in acute distress.    Appearance: Normal appearance.  HENT:     Head: Normocephalic and atraumatic.     Mouth/Throat:     Mouth: Mucous membranes are moist.     Pharynx: Oropharynx is clear.  Eyes:     Conjunctiva/sclera: Conjunctivae normal.  Neck:     Musculoskeletal: Normal range of motion and neck supple.  Cardiovascular:     Rate and Rhythm: Normal rate and regular rhythm.     Pulses: Normal pulses.  Pulmonary:     Effort: Pulmonary effort is normal.     Breath  sounds: Normal breath sounds.  Abdominal:     Palpations: Abdomen is soft.     Tenderness: There is no abdominal tenderness.  Musculoskeletal:        General: No swelling.     Right lower leg: No edema.     Left lower leg: No edema.  Skin:    General: Skin is warm and dry.  Neurological:     General: No focal deficit present.     Mental Status: He is alert and oriented to person, place, and time.      Assessment & Plan:   See Encounters Tab for problem based charting.  Patient discussed with Dr. Criselda Peaches

## 2018-04-25 NOTE — Assessment & Plan Note (Signed)
Patient's blood pressure has been elevated the last 2 visits. He also brings in a home log with repeated readings of 150-60/105-110. Will initiated Lisinopril 10 mg daily.  Return in 6 weeks of for blood pressure check and BMP.

## 2018-04-25 NOTE — Assessment & Plan Note (Signed)
Patient endorses burning/tingling in his feet and hands, particularly at night. He has had success with Gabapentin in the past. Will initiate Gabapentin 300 mg TID.

## 2018-04-25 NOTE — Assessment & Plan Note (Signed)
Patient's A1C remains at goal.  Checking urine microalbumin today prior to initiating ACE inhibitor for better blood pressure control.  Diabetic foot exam reveals some decreased sensation bilaterally, but good palpable pulses.  Patient still does not have insurance coverage, but plans to return to work next month and should get benefits through his employment. Will hold off on eye exam and lipid panel for now.

## 2018-04-25 NOTE — Patient Instructions (Addendum)
[name], It was a pleasure seeing you today!   Today we discussed your Diabetes.  - Your A1C is 6.9. Keep up the great work! - We will not make any adjustments to your insulin today - for the neuropathy, I have sent in Gabapentin which should help the pain. You can take up to 3 pills a day. If night is the worst time for you, you can take 600 mg before bed.   Your blood pressure is above goal of 140/90. I am starting you on a medication called Lisinopril that you will take once daily.  I'd like to see you back in about 6 weeks to recheck your blood pressure and some labs to make sure the medication is working appropriately.    Please call with any questions or concerns you have beforehand.  Take care! Dr. Chesley Mires

## 2018-04-26 LAB — MICROALBUMIN / CREATININE URINE RATIO
Creatinine, Urine: 163.5 mg/dL
Microalb/Creat Ratio: 4 mg/g creat (ref 0–29)
Microalbumin, Urine: 7 ug/mL

## 2018-04-26 NOTE — Progress Notes (Signed)
Internal Medicine Clinic Attending  Case discussed with Dr. Bloomfield at the time of the visit.  We reviewed the resident's history and exam and pertinent patient test results.  I agree with the assessment, diagnosis, and plan of care documented in the resident's note.  

## 2018-05-06 ENCOUNTER — Telehealth (INDEPENDENT_AMBULATORY_CARE_PROVIDER_SITE_OTHER): Payer: Self-pay | Admitting: Orthopedic Surgery

## 2018-05-06 ENCOUNTER — Ambulatory Visit (INDEPENDENT_AMBULATORY_CARE_PROVIDER_SITE_OTHER): Payer: Self-pay | Admitting: Physician Assistant

## 2018-05-06 ENCOUNTER — Encounter (INDEPENDENT_AMBULATORY_CARE_PROVIDER_SITE_OTHER): Payer: Self-pay | Admitting: Physician Assistant

## 2018-05-06 VITALS — Ht 72.0 in | Wt 273.5 lb

## 2018-05-06 DIAGNOSIS — Z945 Skin transplant status: Secondary | ICD-10-CM

## 2018-05-06 DIAGNOSIS — E1142 Type 2 diabetes mellitus with diabetic polyneuropathy: Secondary | ICD-10-CM

## 2018-05-06 NOTE — Progress Notes (Signed)
Office Visit Note   Patient: Jason Monroe           Date of Birth: [birth year]           MRN: [id number] Visit Date: 05/06/2018              Requested by: Lenward Chancellor D, DO [address] [place], [place] [address] PCP: Bridget Hartshorn, DO  Chief Complaint  Patient presents with  . Right Foot - Follow-up    10/24/17 Deb & Skin Graft; small opened area w/some bleeding due to walking at park      HPI: The patient is a 48 year old gentleman who is here for follow-up of his right foot.  He has diabetic insensate polyneuropathy and had a large wound over his right foot which healed following a split thickness skin graft.  He reports more recently he has been getting more active and walked in the [place] for greater than a mile and then also walked around shopping with his brother.  Following this he noticed some drainage on his sock and felt like there was a small blister on the plantar surface of his foot where he had previously been grafted.  He comes in for evaluation of this area today.  Assessment & Plan: Visit Diagnoses:  1. Status post split thickness skin graft   2. Type 2 diabetes mellitus with diabetic polyneuropathy, unspecified whether long term insulin use (HCC)     Plan: Patient was instructed to resume wearing his silver compression sock.  He was advised to utilize these while he is exercising.  For the next several days however we have recommended offloading and elevating and limiting his weightbearing as much as possible to allow the area to heal.  He is going to follow-up next week for recheck to ensure that this is healing.  Follow-Up Instructions: Return in about 1 week (around 05/13/2018).   Ortho Exam  Patient is alert, oriented, no adenopathy, well-dressed, normal affect, normal respiratory effort. The patient has a small 1 cm area of superficial skin loss and discoloration of the tissue underneath.  He has had some purplish discoloration of  the area as it scarred and healed and and this may be consistent with the underlying tissue but organ to have him offload and elevate over the next week and monitor.  He has palpable pedal pulse.  There are no signs of cellulitis or infection.  He does have some mild crusting proximal to this area and xerosis of the foot.  He also has a small defect over the area where he did not quite fill in completely but he is actually broken down over this area and not the adjacent areas.     Imaging: No results found. No images are attached to the encounter.  Labs: Lab Results  Component Value Date   HGBA1C 6.9 (A) 04/25/2018   HGBA1C 6.9 (A) 01/24/2018   HGBA1C 10.7 (H) 10/23/2017   REPTSTATUS 10/29/2017 FINAL 10/24/2017   GRAMSTAIN  10/24/2017    FEW WBC PRESENT,BOTH PMN AND MONONUCLEAR RARE GRAM POSITIVE COCCI IN PAIRS RARE GRAM VARIABLE ROD    CULT  10/24/2017    FEW VIRIDANS STREPTOCOCCUS FEW BACTEROIDES ORALIS BETA LACTAMASE POSITIVE FEW GROUP B STREP(S.AGALACTIAE)ISOLATED TESTING AGAINST S. AGALACTIAE NOT ROUTINELY PERFORMED DUE TO PREDICTABILITY OF AMP/PEN/VAN SUSCEPTIBILITY. Performed at [facility], [address], [place], [place] [address]      Lab Results  Component Value Date   ALBUMIN 2.9 (L)  10/23/2017    Body mass index is 37.09 kg/m.  Orders:  No orders of the defined types were placed in this encounter.  No orders of the defined types were placed in this encounter.    Procedures: No procedures performed  Clinical Data: No additional findings.  ROS:  All other systems negative, except as noted in the HPI. Review of Systems  Objective: Vital Signs: Ht 6' (1.829 m)   Wt 273 lb 8 oz (124.1 kg)   BMI 37.09 kg/m   Specialty Comments:  No specialty comments available.  PMFS History: Patient Active Problem List   Diagnosis Date Noted  . Essential hypertension 04/25/2018  . Diabetic polyneuropathy associated with type 2 diabetes mellitus  (HCC) 04/25/2018  . Healthcare maintenance 11/02/2017  . Status post split thickness skin graft 11/02/2017  . 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 Type 2 diabetes mellitus with right diabetic foot ulcer (HCC)   . Moderate protein-calorie malnutrition (HCC)    Past Medical History:  Diagnosis Date  . Diabetes mellitus without complication (HCC)   . 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 Arrhythmia Sister   . Diabetes type I Maternal Grandmother     Past Surgical History:  Procedure Laterality Date  . I&D EXTREMITY Right 10/24/2017   Procedure: IRRIGATION AND DEBRIDEMENT OF FOOT;  Surgeon: Nadara Mustard, MD;  Location: [facility] OR;  Service: Orthopedics;  Laterality: Right;  . I&D EXTREMITY Right 10/27/2017   Procedure: DEBRIDEMENT AND SKIN GRAFT RIGHT FOOT;  Surgeon: Nadara Mustard, MD;  Location: [facility] OR;  Service: Orthopedics;  Laterality: Right;   Social History   Occupational History  . Not on file  Tobacco Use  . Smoking status: Former Smoker    Types: Pipe  . Smokeless tobacco: Never Used  . Tobacco comment: quit 30yrs ago  Substance and Sexual Activity  . Alcohol use: Not Currently  . Drug use: No  . Sexual activity: Not on file

## 2018-05-06 NOTE — Telephone Encounter (Signed)
Patient lmom requesting a call back from Dr Audrie Lia clinic regarding his foot. Patient's call back # [phone]

## 2018-05-06 NOTE — Telephone Encounter (Signed)
Called pt and his wife states that there is bloody drainage coming from his foot. I asked if there was a new open area on the foot as it was healed as of 04/18/2018. Pt's wife says there is a small red area and draining bloody drain. Pt will come in for an appt today at 2pm for eval.

## 2018-05-13 ENCOUNTER — Other Ambulatory Visit: Payer: Self-pay

## 2018-05-13 ENCOUNTER — Encounter (INDEPENDENT_AMBULATORY_CARE_PROVIDER_SITE_OTHER): Payer: Self-pay | Admitting: Physician Assistant

## 2018-05-13 ENCOUNTER — Ambulatory Visit (INDEPENDENT_AMBULATORY_CARE_PROVIDER_SITE_OTHER): Payer: Self-pay | Admitting: Physician Assistant

## 2018-05-13 VITALS — Ht 72.0 in | Wt 273.5 lb

## 2018-05-13 DIAGNOSIS — L97519 Non-pressure chronic ulcer of other part of right foot with unspecified severity: Secondary | ICD-10-CM

## 2018-05-13 DIAGNOSIS — E11621 Type 2 diabetes mellitus with foot ulcer: Secondary | ICD-10-CM

## 2018-05-13 DIAGNOSIS — E1142 Type 2 diabetes mellitus with diabetic polyneuropathy: Secondary | ICD-10-CM

## 2018-05-13 DIAGNOSIS — Z945 Skin transplant status: Secondary | ICD-10-CM

## 2018-05-15 ENCOUNTER — Encounter (INDEPENDENT_AMBULATORY_CARE_PROVIDER_SITE_OTHER): Payer: Self-pay | Admitting: Physician Assistant

## 2018-05-15 NOTE — Progress Notes (Signed)
Office Visit Note   Patient: Jason Monroe           Date of Birth: [birth year]           MRN: [id number] Visit Date: 05/13/2018              Requested by: Lenward Chancellor D, DO [address] [place], [place] [address] PCP: Bridget Hartshorn, DO  Chief Complaint  Patient presents with  . Right Foot - Routine Post Op    S/p STSG 10/24/17       HPI: The patient is a 48 year old gentleman with insensate diabetic polyneuropathy who had a large wound over his plantar foot on the right and underwent split thickness skin grafting back on 10/24/2017.  He had healed in the plantar surface of his right foot amazingly well but presents today with small recurrence plantar ulceration where he had been grafted.  He has been trying to walk more.  He notes some light-colored drainage from the area but no odor and no pain over the area.  Assessment & Plan: Visit Diagnoses:  1. Status post split thickness skin graft   2. Type 2 diabetes mellitus with diabetic polyneuropathy, unspecified whether long term insulin use (HCC)   3. Type 2 diabetes mellitus with right diabetic foot ulcer (HCC)     Plan: Recommended a silver compression sock which can be worn in direct contact with the ulcerated area.  Also recommend some Iodosorb ointment to the area daily after cleaning the foot with Dial soap and water.  He can then apply the compression sock over this or a Band-Aid should he desire.  Also have referred the patient to [facility] clinic for a custom orthotic as he continues to have recurrent issues with the graft site over the second metatarsal area.  Have also provided him with a felt donut to try to offload the area until he is able to obtain a custom insert for his shoe.  He will follow-up here in 2 weeks or sooner should he have difficulties in the interim.    Follow-Up Instructions: Return in about 2 weeks (around 05/27/2018).   Ortho Exam  Patient is alert, oriented, no adenopathy, well-dressed,  normal affect, normal respiratory effort. The patient has a small recurrent ulceration over the second metatarsal head area which is very superficial involving skin only.  There is no sign of cellulitis or infection.  There is callus and crusting around this area.  He has good palpable pedal pulse.  We will try a silver compression sock to aid with debridement and healing of this area  Imaging: No results found.   Labs: Lab Results  Component Value Date   HGBA1C 6.9 (A) 04/25/2018   HGBA1C 6.9 (A) 01/24/2018   HGBA1C 10.7 (H) 10/23/2017   REPTSTATUS 10/29/2017 FINAL 10/24/2017   GRAMSTAIN  10/24/2017    FEW WBC PRESENT,BOTH PMN AND MONONUCLEAR RARE GRAM POSITIVE COCCI IN PAIRS RARE GRAM VARIABLE ROD    CULT  10/24/2017    FEW VIRIDANS STREPTOCOCCUS FEW BACTEROIDES ORALIS BETA LACTAMASE POSITIVE FEW GROUP B STREP(S.AGALACTIAE)ISOLATED TESTING AGAINST S. AGALACTIAE NOT ROUTINELY PERFORMED DUE TO PREDICTABILITY OF AMP/PEN/VAN SUSCEPTIBILITY. Performed at [facility], [address] [place], [place] [address]      Lab Results  Component Value Date   ALBUMIN 2.9 (L) 10/23/2017    Body mass index is 37.09 kg/m.  Orders:  No orders of the defined types were placed in this encounter.  No orders  of the defined types were placed in this encounter.    Procedures: No procedures performed  Clinical Data: No additional findings.  ROS:  All other systems negative, except as noted in the HPI. Review of Systems  Objective: Vital Signs: Ht 6' (1.829 m)   Wt 273 lb 8 oz (124.1 kg)   BMI 37.09 kg/m   Specialty Comments:  No specialty comments available.  PMFS History: Patient Active Problem List   Diagnosis Date Noted  . Essential hypertension 04/25/2018  . Diabetic polyneuropathy associated with type 2 diabetes mellitus (HCC) 04/25/2018  . Healthcare maintenance 11/02/2017  . Status post split thickness skin graft 11/02/2017  . Diabetic ulcer of foot with  fat layer exposed (HCC) 10/23/2017  . Type 2 diabetes mellitus with right diabetic foot ulcer (HCC)   . Moderate protein-calorie malnutrition (HCC)    Past Medical History:  Diagnosis Date  . Diabetes mellitus without complication (HCC)   . 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 Arrhythmia Sister   . Diabetes type I Maternal Grandmother     Past Surgical History:  Procedure Laterality Date  . I&D EXTREMITY Right 10/24/2017   Procedure: IRRIGATION AND DEBRIDEMENT OF FOOT;  Surgeon: Nadara Mustard, MD;  Location: [facility] OR;  Service: Orthopedics;  Laterality: Right;  . I&D EXTREMITY Right 10/27/2017   Procedure: DEBRIDEMENT AND SKIN GRAFT RIGHT FOOT;  Surgeon: Nadara Mustard, MD;  Location: [facility] OR;  Service: Orthopedics;  Laterality: Right;   Social History   Occupational History  . Not on file  Tobacco Use  . Smoking status: Former Smoker    Types: Pipe  . Smokeless tobacco: Never Used  . Tobacco comment: quit 12yrs ago  Substance and Sexual Activity  . Alcohol use: Not Currently  . Drug use: No  . Sexual activity: Not on file

## 2018-05-24 ENCOUNTER — Other Ambulatory Visit: Payer: Self-pay | Admitting: Internal Medicine

## 2018-05-24 DIAGNOSIS — I10 Essential (primary) hypertension: Secondary | ICD-10-CM

## 2018-05-24 NOTE — Telephone Encounter (Signed)
lisinopril (PRINIVIL,ZESTRIL) 10 MG tablet  gabapentin (NEURONTIN) 300 MG capsule, refill request @  [facility] [address] [place], [place] - [address] [phone] (Phone) [phone] (Fax)

## 2018-05-24 NOTE — Telephone Encounter (Signed)
Gabapentin #90 with 2 refills sent 04/25/2018. Confirmed with [facility] they have this Rx and will get ready for patient. Will forward refill request for lisinopril to PCP. Kinnie Feil, RN, BSN

## 2018-05-26 ENCOUNTER — Telehealth (INDEPENDENT_AMBULATORY_CARE_PROVIDER_SITE_OTHER): Payer: Self-pay

## 2018-05-26 NOTE — Telephone Encounter (Signed)
Called patient and asked the screening questions.  Do you have now or have you had in the past 7 days a fever and/or chills? NO  Do you have now or have you had in the past 7 days a cough? NO  Do you have now or have you had in the last 7 days nausea, vomiting or abdominal pain? NO  Have you been exposed to anyone who has tested positive for COVID-19? NO  Have you or anyone who lives with you traveled within the last month? NO 

## 2018-05-27 ENCOUNTER — Encounter (INDEPENDENT_AMBULATORY_CARE_PROVIDER_SITE_OTHER): Payer: Self-pay | Admitting: Physician Assistant

## 2018-05-27 ENCOUNTER — Other Ambulatory Visit: Payer: Self-pay

## 2018-05-27 ENCOUNTER — Ambulatory Visit (INDEPENDENT_AMBULATORY_CARE_PROVIDER_SITE_OTHER): Payer: Self-pay | Admitting: Physician Assistant

## 2018-05-27 VITALS — Ht 72.0 in | Wt 273.5 lb

## 2018-05-27 DIAGNOSIS — Z945 Skin transplant status: Secondary | ICD-10-CM

## 2018-05-27 DIAGNOSIS — E1142 Type 2 diabetes mellitus with diabetic polyneuropathy: Secondary | ICD-10-CM

## 2018-05-27 NOTE — Progress Notes (Signed)
Office Visit Note   Patient: Jason Monroe           Date of Birth: 1970/12/23           MRN: [id number] Visit Date: 05/27/2018              Requested by: Lenward Chancellor D, DO [address] [place], [place] [address] PCP: Bridget Hartshorn, DO  Chief Complaint  Patient presents with  . Right Foot - Follow-up      HPI: The patient is a 48 yo gentleman who is seen for follow up of his right foot. He has insensate diabetic polyneuropathy and had a large wound over the plantar surface of the right foot which required STSG last year. He has been walking in regular shoes, but developed recurrent ulceration in the area of the graft despite felt donut to off load area. The area has improved over the past couple of weeks. There has been scant drainage from the area. He was given a prescription for a custom insert, but has not obtained this as yet.   Assessment & Plan: Visit Diagnoses:  1. Status post split thickness skin graft   2. Type 2 diabetes mellitus with diabetic polyneuropathy, unspecified whether long term insulin use (HCC)     Plan: He plans to try to go to [facility] to have an insert made. He will continue Vive silver compression sock and follow up in several weeks, sooner if develops any signs of recurrent ulceration.   Follow-Up Instructions: Return in about 4 weeks (around 06/24/2018).   Ortho Exam  Patient is alert, oriented, no adenopathy, well-dressed, normal affect, normal respiratory effort. The area of ulceration has resolved. There was some hyperkeratotic tissue which was removed from the area. No signs of infection or cellulitis. Good pedal pulses.     Imaging: No results found. No images are attached to the encounter.  Labs: Lab Results  Component Value Date   HGBA1C 6.9 (A) 04/25/2018   HGBA1C 6.9 (A) 01/24/2018   HGBA1C 10.7 (H) 10/23/2017   REPTSTATUS 10/29/2017 FINAL 10/24/2017   GRAMSTAIN  10/24/2017    FEW WBC PRESENT,BOTH PMN AND  MONONUCLEAR RARE GRAM POSITIVE COCCI IN PAIRS RARE GRAM VARIABLE ROD    CULT  10/24/2017    FEW VIRIDANS STREPTOCOCCUS FEW BACTEROIDES ORALIS BETA LACTAMASE POSITIVE FEW GROUP B STREP(S.AGALACTIAE)ISOLATED TESTING AGAINST S. AGALACTIAE NOT ROUTINELY PERFORMED DUE TO PREDICTABILITY OF AMP/PEN/VAN SUSCEPTIBILITY. Performed at [facility], [address], [place], [place] [address]      Lab Results  Component Value Date   ALBUMIN 2.9 (L) 10/23/2017    Body mass index is 37.09 kg/m.  Orders:  No orders of the defined types were placed in this encounter.  No orders of the defined types were placed in this encounter.    Procedures: No procedures performed  Clinical Data: No additional findings.  ROS:  All other systems negative, except as noted in the HPI. Review of Systems  Objective: Vital Signs: Ht 6' (1.829 m)   Wt 273 lb 8 oz (124.1 kg)   BMI 37.09 kg/m   Specialty Comments:  No specialty comments available.  PMFS History: Patient Active Problem List   Diagnosis Date Noted  . Essential hypertension 04/25/2018  . Diabetic polyneuropathy associated with type 2 diabetes mellitus (HCC) 04/25/2018  . Healthcare maintenance 11/02/2017  . Status post split thickness skin graft 11/02/2017  . 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  Type 2 diabetes mellitus with right diabetic foot ulcer (HCC)   . Moderate protein-calorie malnutrition (HCC)    Past Medical History:  Diagnosis Date  . Diabetes mellitus without complication (HCC)   . 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 Arrhythmia Sister   . Diabetes type I Maternal Grandmother     Past Surgical History:  Procedure Laterality Date  . I&D EXTREMITY Right 10/24/2017   Procedure: IRRIGATION AND DEBRIDEMENT OF FOOT;  Surgeon: Nadara Mustard, MD;  Location: [facility] OR;  Service: Orthopedics;  Laterality:  Right;  . I&D EXTREMITY Right 10/27/2017   Procedure: DEBRIDEMENT AND SKIN GRAFT RIGHT FOOT;  Surgeon: Nadara Mustard, MD;  Location: [facility] OR;  Service: Orthopedics;  Laterality: Right;   Social History   Occupational History  . Not on file  Tobacco Use  . Smoking status: Former Smoker    Types: Pipe  . Smokeless tobacco: Never Used  . Tobacco comment: quit 57yrs ago  Substance and Sexual Activity  . Alcohol use: Not Currently  . Drug use: No  . Sexual activity: Not on file

## 2018-05-30 ENCOUNTER — Other Ambulatory Visit: Payer: Self-pay

## 2018-05-30 ENCOUNTER — Encounter: Payer: Self-pay | Admitting: Internal Medicine

## 2018-05-30 ENCOUNTER — Ambulatory Visit (INDEPENDENT_AMBULATORY_CARE_PROVIDER_SITE_OTHER): Payer: Self-pay | Admitting: Internal Medicine

## 2018-05-30 DIAGNOSIS — L97519 Non-pressure chronic ulcer of other part of right foot with unspecified severity: Secondary | ICD-10-CM

## 2018-05-30 DIAGNOSIS — I10 Essential (primary) hypertension: Secondary | ICD-10-CM

## 2018-05-30 DIAGNOSIS — E11621 Type 2 diabetes mellitus with foot ulcer: Secondary | ICD-10-CM

## 2018-05-30 MED ORDER — INSULIN NPH ISOPHANE & REGULAR (70-30) 100 UNIT/ML ~~LOC~~ SUSP: SUBCUTANEOUS | 3 refills | Status: DC

## 2018-05-30 NOTE — Progress Notes (Addendum)
Medicine attending: Medical history, presenting problems, and medications, reviewed with resident physician Dr Carly Bloomfield on the day of the patient telephone consultation and I concur with her evaluation and management plan. 

## 2018-05-30 NOTE — Assessment & Plan Note (Signed)
Patient was initiated on Lisinopril 10 mg last visit. His blood pressures have improved from average of 150/90 to consistently 120-130/80s. Will continue current regimen.  Plan to recheck BMP follow-up visit.

## 2018-05-30 NOTE — Progress Notes (Signed)
  This is a telephone encounter between [employer] and Jason Monroe on 05/30/2018 for Diabetes and HTN follow-up. The visit was conducted with the patient located at home and Jason Monroe at [facility]. The patient's identity was confirmed using their DOB and current address. The patient has consented to being evaluated through a telephone encounter and understands the associated risks (an examination cannot be done and the patient may need to come in for an appointment) / benefits (allows the patient to remain at home, decreasing exposure to coronavirus). I personally spent 15 minutes on medical discussion.   CC: DM, HTN  HPI:  [name] is a 48 y.o. gentleman with PMHx of type II DM and HTN. A telephone encounter was done to discuss updates on his chronic medical conditions.   Past Medical History:  Diagnosis Date  . Diabetes mellitus without complication (HCC)   . 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  HENT: Negative for congestion.   Eyes: Negative for blurred vision.  Respiratory: Negative for cough and shortness of breath.   Cardiovascular: Negative for chest pain.  Gastrointestinal: Negative for nausea and vomiting.  Genitourinary: Negative for frequency.  Musculoskeletal: Negative for myalgias.  Neurological: Negative for dizziness and headaches.  Endo/Heme/Allergies: Negative for polydipsia.    Assessment & Plan:   See Encounters Tab for problem based charting.  Patient discussed with Dr. Cyndie Chime

## 2018-05-30 NOTE — Assessment & Plan Note (Addendum)
Patient reports CBGs consistently ~230 when he checks first thing in the morning and before bed. This has been going on the last 2 weeks since Dr. Lajoyce Corners advised him to remain off of his feet for 4 weeks due to a superficial ulceration seen at his last exam s/p split thickness skin graft. He has tried decreasing the amount of carbs he eats during the day without much improvement. No recent illness. Denies lightheadedness, fatigue, polyuria, polydipsia, nausea, or vomiting.  Will increase morning Novolin dose from 22 to 24 and evening dose from 20 to 22 units. Will plan to call back in 2 weeks to see if this has improved glycemic control.  The gabapentin that was started at last visit has significantly helped his neuropathy pain. Will continue current dosing.

## 2018-06-23 ENCOUNTER — Other Ambulatory Visit: Payer: Self-pay | Admitting: Internal Medicine

## 2018-06-23 DIAGNOSIS — I10 Essential (primary) hypertension: Secondary | ICD-10-CM

## 2018-06-24 ENCOUNTER — Encounter (INDEPENDENT_AMBULATORY_CARE_PROVIDER_SITE_OTHER): Payer: Self-pay | Admitting: Physician Assistant

## 2018-06-24 ENCOUNTER — Ambulatory Visit (INDEPENDENT_AMBULATORY_CARE_PROVIDER_SITE_OTHER): Payer: Self-pay | Admitting: Physician Assistant

## 2018-06-24 ENCOUNTER — Other Ambulatory Visit: Payer: Self-pay

## 2018-06-24 VITALS — Ht 72.0 in | Wt 273.5 lb

## 2018-06-24 DIAGNOSIS — Z945 Skin transplant status: Secondary | ICD-10-CM

## 2018-06-24 DIAGNOSIS — E1142 Type 2 diabetes mellitus with diabetic polyneuropathy: Secondary | ICD-10-CM

## 2018-06-24 NOTE — Progress Notes (Signed)
Office Visit Note   Patient: Jason Monroe           Date of Birth: 05/17/1970           MRN: [id number] Visit Date: 06/24/2018              Requested by: Lenward ChancellorBloomfield, Carley D, DO [address] [place], [place] [address] PCP: Bridget HartshornBloomfield, Carley D, DO  Chief Complaint  Patient presents with  . Right Foot - Follow-up    Right foot SG 10/27/17      HPI: The patient is a 48 yo gentleman who is seen for follow up following a graft to the right foot plantar surface last year. The graft has healed well, but he has had some intermittent problems with breakdown over the plantar foot over the past several months as he increased his activities.  He has had no further breakdown over the plantar surface of his foot. He is working with [facility] for a custom insert. He is wearing medical compression stockings.   Assessment & Plan: Visit Diagnoses:  1. Status post split thickness skin graft   2. Type 2 diabetes mellitus with diabetic polyneuropathy, unspecified whether long term insulin use (HCC)     Plan: Continue to work with [facility] for fabrication of custom insert. Continue medical compression socks. May return to activities to tolerance but monitor closely for breakdown or ulceration of either foot. Recommended to gradually resume any activities which would cause increased friction over the plantar foot such as walking longer distances, walking or jogging on treadmill, etc. He will follow up prn for any concerns.   Follow-Up Instructions: Return if symptoms worsen or fail to improve.   Ortho Exam  Patient is alert, oriented, no adenopathy, well-dressed, normal affect, normal respiratory effort. The right foot plantar surface is well healed and without signs of breakdown, pressure areas or ulcerations. Good pedal pulses. Mild crusting over the central foot. No signs of infection or cellulitis of the foot.   Imaging: No results found.   Labs: Lab Results  Component Value  Date   HGBA1C 6.9 (A) 04/25/2018   HGBA1C 6.9 (A) 01/24/2018   HGBA1C 10.7 (H) 10/23/2017   REPTSTATUS 10/29/2017 FINAL 10/24/2017   GRAMSTAIN  10/24/2017    FEW WBC PRESENT,BOTH PMN AND MONONUCLEAR RARE GRAM POSITIVE COCCI IN PAIRS RARE GRAM VARIABLE ROD    CULT  10/24/2017    FEW VIRIDANS STREPTOCOCCUS FEW BACTEROIDES ORALIS BETA LACTAMASE POSITIVE FEW GROUP B STREP(S.AGALACTIAE)ISOLATED TESTING AGAINST S. AGALACTIAE NOT ROUTINELY PERFORMED DUE TO PREDICTABILITY OF AMP/PEN/VAN SUSCEPTIBILITY. Performed at [facility], [address] [place], [place] [address]      Lab Results  Component Value Date   ALBUMIN 2.9 (L) 10/23/2017    Body mass index is 37.09 kg/m.  Orders:  No orders of the defined types were placed in this encounter.  No orders of the defined types were placed in this encounter.    Procedures: No procedures performed  Clinical Data: No additional findings.  ROS:  All other systems negative, except as noted in the HPI. Review of Systems  Objective: Vital Signs: Ht 6' (1.829 m)   Wt 273 lb 8 oz (124.1 kg)   BMI 37.09 kg/m   Specialty Comments:  No specialty comments available.  PMFS History: Patient Active Problem List   Diagnosis Date Noted  . Essential hypertension 04/25/2018  . Diabetic polyneuropathy associated with type 2 diabetes mellitus (HCC) 04/25/2018  . Healthcare  maintenance 11/02/2017  . Status post split thickness skin graft 11/02/2017  . 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 Type 2 diabetes mellitus with right diabetic foot ulcer (HCC)   . Moderate protein-calorie malnutrition (HCC)    Past Medical History:  Diagnosis Date  . Diabetes mellitus without complication (HCC)   . 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 Arrhythmia Sister   . Diabetes type I Maternal Grandmother     Past Surgical History:   Procedure Laterality Date  . I&D EXTREMITY Right 10/24/2017   Procedure: IRRIGATION AND DEBRIDEMENT OF FOOT;  Surgeon: Nadara Mustard, MD;  Location: [facility] OR;  Service: Orthopedics;  Laterality: Right;  . I&D EXTREMITY Right 10/27/2017   Procedure: DEBRIDEMENT AND SKIN GRAFT RIGHT FOOT;  Surgeon: Nadara Mustard, MD;  Location: [facility] OR;  Service: Orthopedics;  Laterality: Right;   Social History   Occupational History  . Not on file  Tobacco Use  . Smoking status: Former Smoker    Types: Pipe  . Smokeless tobacco: Never Used  . Tobacco comment: quit 34yrs ago  Substance and Sexual Activity  . Alcohol use: Not Currently  . Drug use: No  . Sexual activity: Not on file

## 2018-07-28 ENCOUNTER — Telehealth: Payer: Self-pay | Admitting: Pharmacy Technician

## 2018-07-28 NOTE — Telephone Encounter (Signed)
Diabetes Management Follow Up Jason Monroe is a 48 y.o. male who was contacted for DM management. Identity was verified using date of birth and address.   Diabetes medications: Novolin 70/30 inject 24 units in the morning and 22 units at night.  - The patient has no insurance and purchases insulin and supplies over the counter - We discussed the possibility of changing his medication regimen to include a drug with cardiac benefit, such as Victoza. This would be affordable if the patient qualified for a  manufacturer assistance program.  Home FBG average 170-200 mg/dL (correlates with M6Q of around 8%) - Patient checks blood glucose 4 times daily: before breakfast, after meals, and at bedtime Hypoglycemia symptoms: none. No home BG < 70 mg/dL Hyperglycemia symptoms: blurry vision. Max home BG 260 mg/dL   Diet and exercise were discussed with patient: - Patient has changed his diet and is being more mindful of the amount of carbohydrates in foods. Mostly eating low carb and had seen a dietician recently for advice. - Patient is recovering from diabetic foot ulcer and is slowly increasing physical activity. He states that his blood sugar is lower when he is able to be more active, and he knows it is good for him.  Blood glucose control: improved status post insulin dose increase at last physician visit  Interventions today: - Reviewed appropriate home blood glucose monitoring -  Patient will discuss his financial situation with household members to see if he would be eligible for patient assistance program. Amenable to starting Victoza if he can get it via this program. - If approved for patient assistance program and initiating Victoza, would consider decreasing insulin to 18 units in the morning and 16 units in the evening for a 25% total daily insulin dose reduction at that time  Advised to contact clinic or seek medical attention if concerns or symptoms arise. The patient verbalized  understanding of information provided by repeating back concepts discussed. Will notify PCP of findings  Follow-up - Awaiting patient-supplied proof of income to complete application for patient assistance program  Harlow Mares, PharmD PGY1 Pharmacy Resident Phone [phone]  07/28/2018   12:52 PM

## 2018-08-02 ENCOUNTER — Other Ambulatory Visit: Payer: Self-pay | Admitting: Internal Medicine

## 2018-08-02 DIAGNOSIS — E1142 Type 2 diabetes mellitus with diabetic polyneuropathy: Secondary | ICD-10-CM

## 2018-08-04 ENCOUNTER — Other Ambulatory Visit: Payer: Self-pay

## 2018-08-04 ENCOUNTER — Ambulatory Visit (INDEPENDENT_AMBULATORY_CARE_PROVIDER_SITE_OTHER): Payer: Self-pay | Admitting: Pharmacist

## 2018-08-04 DIAGNOSIS — L97519 Non-pressure chronic ulcer of other part of right foot with unspecified severity: Secondary | ICD-10-CM

## 2018-08-04 DIAGNOSIS — E11621 Type 2 diabetes mellitus with foot ulcer: Secondary | ICD-10-CM

## 2018-08-04 LAB — POCT GLYCOSYLATED HEMOGLOBIN (HGB A1C): Hemoglobin A1C: 9.1 % — AB (ref 4.0–5.6)

## 2018-08-04 LAB — GLUCOSE, CAPILLARY: Glucose-Capillary: 177 mg/dL — ABNORMAL HIGH (ref 70–99)

## 2018-08-04 NOTE — Progress Notes (Signed)
We are working to get Victoza through manufacturer assistance.We were unable to obtain samples of Victoza, so we gave Jason Monroe an Ozempic sample and pen needles. We educated the patient and his wife (wife performs injections) on how to inject Victoza and ordered A1c, glucose, and BMP. Patient understands that he will need to switch from weekly to daily injections once he can get the Victoza.  [name] PharmD Candidate [employer]   Medication Samples have been provided to the patient.  Drug: Ozempic Strength: 2mg /1.7ml Qty: 1 pen LOT: [id number]  Exp.Date:05/2020 Dosing instructions: Inject 0.25 mg into the skin weekly  The patient has been instructed regarding the correct time, dose, and frequency of taking this medication, including desired effects and most common side effects.   Samples signed for by Toneth Sang/Jen Laural Golden 2:57 PM 08/04/2018

## 2018-08-05 LAB — BMP8+ANION GAP
Anion Gap: 14 mmol/L (ref 10.0–18.0)
BUN/Creatinine Ratio: 15 (ref 9–20)
BUN: 15 mg/dL (ref 6–24)
CO2: 24 mmol/L (ref 20–29)
Calcium: 9.8 mg/dL (ref 8.7–10.2)
Chloride: 100 mmol/L (ref 96–106)
Creatinine, Ser: 0.97 mg/dL (ref 0.76–1.27)
GFR calc Af Amer: 108 mL/min/{1.73_m2} (ref >59)
GFR calc non Af Amer: 93 mL/min/{1.73_m2} (ref >59)
Glucose: 184 mg/dL — ABNORMAL HIGH (ref 65–99)
Potassium: 4.5 mmol/L (ref 3.5–5.2)
Sodium: 138 mmol/L (ref 134–144)

## 2018-08-09 MED ORDER — LIRAGLUTIDE 18 MG/3ML ~~LOC~~ SOPN: 0.6000 mg | PEN_INJECTOR | Freq: Every day | SUBCUTANEOUS | 0 refills | Status: DC

## 2018-08-09 NOTE — Addendum Note (Signed)
Addended by: Forde Dandy on: 08/09/2018 05:08 PM   Modules accepted: Orders

## 2018-08-12 ENCOUNTER — Telehealth: Payer: Self-pay | Admitting: Pharmacist

## 2018-08-12 NOTE — Progress Notes (Signed)
Diabetes Management Follow Up CONSTANT MANDEVILLE is a 48 y.o. male who was contacted for DM management. Identity was verified using date of birth and address, spoke to patient's Mom, Mel Almond, who manages pt medications at home.  Diabetes medications: pre-mixed insulin Novolin 70/30 12 units in the morning and 11 units in the evening, semaglutide 0.25 mg once weekly while awaiting Novo approval for Victoza. Patient correctly reports DM regimen and adherence  Home BG average in the 100s, no signs/symptoms of concern, the past few days have been 200 in the mornings due to dietary changes (BG in the 100s throughout the day). We had also decreased insulin dose last week due to starting a GLP-1 agonist.   A/P Blood glucose control: A1C increased from 6.9 to 9.1, patient's mom reports mixed contributers including challenges with insulin access and RLE skin graft procedure limiting ADLs.  Interventions today: Increase evening dose of Novolin from 11 units to 12 units. Over the weekend, if morning BG still in the 200s, increase to 14 units. Unfortunately, will need to wait a few more weeks before titrating the semaglutide, and hoping to secure access to liraglutide instead.  Advised dietary management. Mom reports recent death of family member which may be contributing, I recommended referral to integrated behavioral specialist and she states she will consider.  Advised to contact clinic or seek medical attention if concerns or symptoms arise. Will notify PCP of findings  The mom verbalized understanding of information provided by repeating back concepts discussed.  Follow-up Mel Almond states she will call clinic 08/15/18 with home BG results  Flossie Dibble

## 2018-08-22 ENCOUNTER — Encounter: Payer: Self-pay | Admitting: Internal Medicine

## 2018-08-22 ENCOUNTER — Other Ambulatory Visit: Payer: Self-pay

## 2018-08-22 ENCOUNTER — Ambulatory Visit (INDEPENDENT_AMBULATORY_CARE_PROVIDER_SITE_OTHER): Payer: Self-pay | Admitting: Internal Medicine

## 2018-08-22 VITALS — BP 139/92 | HR 100 | Temp 98.0°F | Ht 72.0 in | Wt 279.8 lb

## 2018-08-22 DIAGNOSIS — L97419 Non-pressure chronic ulcer of right heel and midfoot with unspecified severity: Secondary | ICD-10-CM

## 2018-08-22 DIAGNOSIS — L84 Corns and callosities: Secondary | ICD-10-CM

## 2018-08-22 DIAGNOSIS — Z794 Long term (current) use of insulin: Secondary | ICD-10-CM

## 2018-08-22 DIAGNOSIS — Z79899 Other long term (current) drug therapy: Secondary | ICD-10-CM

## 2018-08-22 DIAGNOSIS — E11621 Type 2 diabetes mellitus with foot ulcer: Secondary | ICD-10-CM

## 2018-08-22 DIAGNOSIS — I10 Essential (primary) hypertension: Secondary | ICD-10-CM

## 2018-08-22 LAB — GLUCOSE, CAPILLARY: Glucose-Capillary: 192 mg/dL — ABNORMAL HIGH (ref 70–99)

## 2018-08-22 NOTE — Progress Notes (Signed)
   CC: DM  HPI:  JasonJason Monroe is a 48 y.o. gentleman with history DM, HTN who presents for follow-up on his DM. He has been getting medication assistance with Dr. Maudie Mercury and nutrition education with [employer] which are both going well. Recently started on free sample of Ozempic while awaiting approval for Victoza and [place].  Patient was instructed to decrease Novolin 70/30 while initiating Ozempic. Noticed his home blood glucose levels were consistently above 200. Since going back to 22 units BID, his CBGs are back to ranging from 150-180. His tolerating Ozempic well without any side effects.  He endorses a lot of stress dealing with a recent family loss which has made it more challenging for dietary compliance, but the last couple of weeks he feels like he has gotten back on track.   His mom has been keeping a close eye on a puncture wound on the bottom of his foot. No concerns for erythema, swelling or drainage.   Past Medical History:  Diagnosis Date  . Diabetes mellitus without complication ([place])   . Diabetic ulcer of foot with fat layer exposed ([place]) 10/23/2017   Review of Systems:  Review of Systems  All other systems reviewed and are negative.   Physical Exam:  Vitals:   08/22/18 1402  BP: (!) 139/92  Pulse: 100  Temp: 98 F (36.7 C)  TempSrc: Oral  SpO2: 100%  Weight: 279 lb 12.8 oz (126.9 kg)  Height: 6' (1.829 m)   Physical Exam Constitutional:      General: He is not in acute distress.    Appearance: He is obese.  Eyes:     Conjunctiva/sclera: Conjunctivae normal.  Cardiovascular:     Rate and Rhythm: Normal rate and regular rhythm.  Pulmonary:     Effort: Pulmonary effort is normal.     Breath sounds: Normal breath sounds.  Abdominal:     General: Bowel sounds are normal.     Palpations: Abdomen is soft.     Tenderness: There is no abdominal tenderness.  Musculoskeletal:     Right lower leg: No edema.     Left lower leg: No edema.  Feet:     Comments:  Right foot with small puncture wound with surrounding callus. No tenderness, erythema or drainage.  Neurological:     Mental Status: He is alert.  Psychiatric:        Mood and Affect: Mood normal.        Behavior: Behavior normal.      Assessment & Plan:   See Encounters Tab for problem based charting.  Patient discussed with Dr. Angelia Mould

## 2018-08-22 NOTE — Progress Notes (Signed)
Patient is currently taking 22 units BID of Novolin 70/30 due to home BG elevations (> 200). Will consider switching to [place] since available free through [employer] patient assistance program. Will also anticipate patient will be approved for free Victoza through this program.

## 2018-08-22 NOTE — Assessment & Plan Note (Signed)
Blood pressure at goal on current therapy. Ranges from 427-062 systolic and 37-62 diastolic on review of home readings.  Continue Lisinopril 10 mg.

## 2018-08-22 NOTE — Patient Instructions (Addendum)
Mr. Kapusta, It was great seeing you!  Your blood sugars are looking excellent! We will continue your current medications as prescribed. Dr. Maudie Mercury is hoping to hear back soon about the free medication plan. She will let you know when this goes through.   Continue to check your feet daily. Remember to increase your activity slowly.   Your blood pressure also looks good today so we will not make any changes.   You will be due for an A1C in September so I'll plan on seeing you back at that time. Please call in the mean time with any questions or concerns.   Take care! Dr. Koleen Distance

## 2018-08-22 NOTE — Assessment & Plan Note (Signed)
Patient's most recent A1C had increased from 6.9>9.1 which he attributes to poor eating habits and decreased activity. Appreciate assistance from Dr. Maudie Mercury. He has recently been started on free sample of Ozempic while she works on getting him approved for free medication assistance. Plan will be to transition to [place].  Review of most recent CBGs shows average of 150-180 on newly started Ozempic and Novolin 22 units BID. Will continue current regimen for now until he is approved for medication assistance program.

## 2018-08-24 NOTE — Progress Notes (Signed)
Internal Medicine Clinic Attending  Case discussed with Dr. Bloomfield at the time of the visit.  We reviewed the resident's history and exam and pertinent patient test results.  I agree with the assessment, diagnosis, and plan of care documented in the resident's note.  

## 2018-09-05 ENCOUNTER — Other Ambulatory Visit: Payer: Self-pay | Admitting: Internal Medicine

## 2018-09-05 DIAGNOSIS — E1142 Type 2 diabetes mellitus with diabetic polyneuropathy: Secondary | ICD-10-CM

## 2018-09-09 ENCOUNTER — Telehealth: Payer: Self-pay | Admitting: Pharmacist

## 2018-09-09 NOTE — Progress Notes (Signed)
Called patient to follow up on DM management per PCP consult, spoke to [facility], patient's mother, who assists in his care.  Cora reports home BG have remained in the 100s, only 4 readings around 200 this past week, patient is currently taking mixed insulin 20 units BID and semaglutide 0.25 mg weekly.  No changes recommended today. Awaiting receipt of free medications from Novo patient assistance program, then will plan to adjust therapy. Planning to switch to [place] (insulin degludec) and liraglutide (Victoza), titrate Victoza, then possibly switch to combination injection delgludec+liraglutide (Xultophy) once patient is stable.  Will call back in 1 week.

## 2018-09-26 LAB — HM DIABETES EYE EXAM

## 2018-09-28 ENCOUNTER — Other Ambulatory Visit: Payer: Self-pay | Admitting: Internal Medicine

## 2018-09-28 DIAGNOSIS — I10 Essential (primary) hypertension: Secondary | ICD-10-CM

## 2018-09-29 ENCOUNTER — Telehealth: Payer: Self-pay | Admitting: Pharmacist

## 2018-09-29 DIAGNOSIS — E11621 Type 2 diabetes mellitus with foot ulcer: Secondary | ICD-10-CM

## 2018-09-29 MED ORDER — LIRAGLUTIDE 18 MG/3ML ~~LOC~~ SOPN: 1.8000 mg | PEN_INJECTOR | Freq: Every day | SUBCUTANEOUS | 1 refills | Status: DC

## 2018-09-29 MED FILL — NOVOFINE 32G NEEDLES: 32G X 6 MM | 90 days supply | Qty: 200 | Fill #0

## 2018-09-29 MED FILL — VICTOZA 18 MG/3 ML INJECT P: 18 | 112 days supply | Qty: 36 | Fill #0

## 2018-09-29 NOTE — Progress Notes (Signed)
Patient was approved for free medication through [employer]). He will be getting Victoza and insulin through this program, and the medication will ship to [facility]. Contacted patient's mother, Mel Almond to notify her, advised her to contact clinic if she has any questions or concerns and she verbalized understanding.

## 2018-10-04 ENCOUNTER — Other Ambulatory Visit: Payer: Self-pay | Admitting: Internal Medicine

## 2018-10-04 DIAGNOSIS — E1142 Type 2 diabetes mellitus with diabetic polyneuropathy: Secondary | ICD-10-CM

## 2018-10-13 ENCOUNTER — Telehealth: Payer: Self-pay | Admitting: Pharmacist

## 2018-10-13 DIAGNOSIS — E11621 Type 2 diabetes mellitus with foot ulcer: Secondary | ICD-10-CM

## 2018-10-17 MED ORDER — XULTOPHY 100-3.6 UNIT-MG/ML ~~LOC~~ SOPN: 40.0000 [IU] | PEN_INJECTOR | Freq: Every day | SUBCUTANEOUS | 2 refills | Status: DC

## 2018-10-18 NOTE — Telephone Encounter (Signed)
Diabetes Management Follow Up Jason Monroe is a 48 y.o. male who was contacted for DM management. Spoke with his mother Mel Almond. Pt's identity was verified using date of birth.  Diabetes medications:  -Novolog 70/30 20 units QAM & QPM -Victoza 1.8mg  QAM   Patient correctly reports DM regimen and adherence   Patient Self-directed Goals: Patient has been doing well lately and has a goal of decreasing his insulin and maybe getting off insulin if possible  Home BG average 135 mg/dL (correlates with A1C of around 6.5%) FBG: 95-135 over the past 2 weeks  2hr post-prandial: pt always checks before meals Pt reports their BG has only has been over 150 2x in the last 2 weeks Hypoglycemia symptoms: none Hyperglycemia symptoms: none  Medication: -Pt just received 4 months worth of free Trulicity -Pt still has a large amount of Victoza (10 pens) left   A/P Blood glucose control: improved  Reviewed appropriate home blood glucose monitoring.  -Advised pt to check BG 2 hours after dinner instead of always before meals  -No medication changes today because the pt's BG seems to be very well controlled. He denies any hypoglycemic episodes.  -Plan to transition patient to Xultophy and d/c Novolog 70/30 and Victoza once pt receives free samples of Xultophy -Advised pt to not start using the Trulicity  Advised to contact clinic or seek medical attention if concerns or symptoms arise  Will notify PCP of findings  The family verbalized understanding of information provided by repeating back concepts discussed.  Follow-up Will f/u via phone call in 1 week  Kennon Holter, PharmD PGY1 Ambulatory Care Pharmacy Resident [employer] Phone: [phone]

## 2018-11-22 ENCOUNTER — Other Ambulatory Visit: Payer: Self-pay | Admitting: Internal Medicine

## 2018-11-22 DIAGNOSIS — E1142 Type 2 diabetes mellitus with diabetic polyneuropathy: Secondary | ICD-10-CM

## 2018-12-05 ENCOUNTER — Ambulatory Visit: Payer: Self-pay | Admitting: Pharmacist

## 2018-12-05 DIAGNOSIS — L97519 Non-pressure chronic ulcer of other part of right foot with unspecified severity: Secondary | ICD-10-CM

## 2018-12-05 DIAGNOSIS — E11621 Type 2 diabetes mellitus with foot ulcer: Secondary | ICD-10-CM

## 2018-12-07 NOTE — Progress Notes (Signed)
S: Jason Monroe is a 48 y.o. male reports to clinical pharmacist appointment for diabetes management.  Allergies  Allergen Reactions  . Metformin And Related Hives    Current Outpatient Medications:  .  blood glucose meter kit and supplies KIT, Dispense based on patient and insurance preference. Use up to four times daily as directed. (FOR ICD-9 250.00, 250.01)., Disp: 1 each, Rfl: 0 .  gabapentin (NEURONTIN) 300 MG capsule, TAKE 1 CAPSULE BY MOUTH THREE TIMES DAILY, Disp: 90 capsule, Rfl: 0 .  ibuprofen (ADVIL,MOTRIN) 200 MG tablet, Take 600 mg by mouth every 12 (twelve) hours. , Disp: , Rfl:  .  Insulin Degludec-Liraglutide (XULTOPHY) 100-3.6 UNIT-MG/ML SOPN, Inject 40 Units into the skin daily., Disp: 16 pen, Rfl: 2 .  Insulin Pen Needle (PEN NEEDLES) 31G X 5 MM MISC, 1 Units by Does not apply route 2 (two) times daily., Disp: 100 each, Rfl: 0 .  Insulin Syringes, Disposable, U-100 0.3 ML MISC, 13 units of novolin at breakfast and dinner, Disp: 100 each, Rfl: 0 .  lisinopril (ZESTRIL) 10 MG tablet, Take 1 tablet by mouth once daily, Disp: 90 tablet, Rfl: 0 .  silver sulfADIAZINE (SILVADENE) 1 % cream, Apply 1 application topically daily. Apply to right foot daily after gentle cleaning, Disp: 400 g, Rfl: 0 Past Medical History:  Diagnosis Date  . Diabetes mellitus without complication ([place])   . Diabetic ulcer of foot with fat layer exposed ([place]) 10/23/2017   Social History   Socioeconomic History  . Marital status: Single    Spouse name: Not on file  . Number of children: Not on file  . Years of education: Not on file  . Highest education level: Not on file  Occupational History  . Not on file  Social Needs  . Financial resource strain: Not on file  . Food insecurity    Worry: Not on file    Inability: Not on file  . Transportation needs    Medical: Not on file    Non-medical: Not on file  Tobacco Use  . Smoking status: Former Smoker    Types: Pipe  . Smokeless tobacco:  Never Used  . Tobacco comment: quit 20yr ago  Substance and Sexual Activity  . Alcohol use: Not Currently  . Drug use: No  . Sexual activity: Not on file  Lifestyle  . Physical activity    Days per week: Not on file    Minutes per session: Not on file  . Stress: Not on file  Relationships  . Social [place] phone: Not on file    Gets together: Not on file    Attends religious service: Not on file    Active member of club or organization: Not on file    Attends meetings of clubs or organizations: Not on file    Relationship status: Not on file  Other Topics Concern  . Not on file  Social History Narrative  . Not on file   Family History  Problem Relation Age of Onset  . Arrhythmia Sister   . Diabetes type I Maternal Grandmother    O:    Component Value Date/Time   GLUCOSE 184 (H) 08/04/2018 1407   GLUCOSE 143 (H) 10/28/2017 0512   HGBA1C 9.1 (A) 08/04/2018 1421   HGBA1C 10.7 (H) 10/23/2017 1419   NA 138 08/04/2018 1407   K 4.5 08/04/2018 1407   CL 100 08/04/2018 1407   CO2 24 08/04/2018 1407   BUN  15 08/04/2018 1407   CREATININE 0.97 08/04/2018 1407   CALCIUM 9.8 08/04/2018 1407   GFRNONAA 93 08/04/2018 1407   GFRAA 108 08/04/2018 1407   AST 22 10/23/2017 1015   ALT 22 10/23/2017 1015   WBC 13.9 (H) 10/28/2017 0512   HGB 11.3 (L) 10/28/2017 0512   HCT 34.2 (L) 10/28/2017 0512   PLT 404 (H) 10/28/2017 0512   Ht Readings from Last 2 Encounters:  08/22/18 6' (1.829 m)  06/24/18 6' (1.829 m)   Wt Readings from Last 2 Encounters:  08/22/18 279 lb 12.8 oz (126.9 kg)  06/24/18 273 lb 8 oz (124.1 kg)   There is no height or weight on file to calculate BMI. BP Readings from Last 3 Encounters:  08/22/18 (!) 139/92  04/25/18 (!) 152/104  01/24/18 135/90    A/P: Diabetes medications: insulin aspart mix 70/30, 20 units BID, liraglutide (Victoza) 1.8 mg daily; patient correctly reports DM regimen and adherence  Patient did not bring BG meter to  clinic, but reports home BG average 140 mg/dL (correlates with A1C of around 6.5%), patient reports no hypoglycemia and no signs/symptoms of concern today.   Blood glucose control: improved. For regimen simplification and affordability, patient was converted to Xultophy 40 units daily with free access through [facility] patient assistance program. Education was provided on switch from current regimen to [employer].  Advised to contact clinic or seek medical attention if concerns or symptoms arise. Will notify PCP of findings, advised patient to schedule PCP follow up for A1c and influenza vaccine.  The patient verbalized understanding of information provided by repeating back concepts discussed.

## 2019-01-02 ENCOUNTER — Ambulatory Visit (INDEPENDENT_AMBULATORY_CARE_PROVIDER_SITE_OTHER): Payer: Self-pay | Admitting: Internal Medicine

## 2019-01-02 ENCOUNTER — Other Ambulatory Visit: Payer: Self-pay

## 2019-01-02 VITALS — BP 154/92 | HR 87 | Temp 98.4°F | Wt 278.0 lb

## 2019-01-02 DIAGNOSIS — Z0000 Encounter for general adult medical examination without abnormal findings: Secondary | ICD-10-CM

## 2019-01-02 DIAGNOSIS — E11621 Type 2 diabetes mellitus with foot ulcer: Secondary | ICD-10-CM

## 2019-01-02 DIAGNOSIS — L97519 Non-pressure chronic ulcer of other part of right foot with unspecified severity: Secondary | ICD-10-CM

## 2019-01-02 DIAGNOSIS — Z23 Encounter for immunization: Secondary | ICD-10-CM

## 2019-01-02 DIAGNOSIS — Z79899 Other long term (current) drug therapy: Secondary | ICD-10-CM

## 2019-01-02 DIAGNOSIS — E119 Type 2 diabetes mellitus without complications: Secondary | ICD-10-CM

## 2019-01-02 DIAGNOSIS — Z794 Long term (current) use of insulin: Secondary | ICD-10-CM

## 2019-01-02 DIAGNOSIS — I10 Essential (primary) hypertension: Secondary | ICD-10-CM

## 2019-01-02 LAB — POCT GLYCOSYLATED HEMOGLOBIN (HGB A1C): Hemoglobin A1C: 6.2 % — AB (ref 4.0–5.6)

## 2019-01-02 LAB — GLUCOSE, CAPILLARY: Glucose-Capillary: 133 mg/dL — ABNORMAL HIGH (ref 70–99)

## 2019-01-02 MED ORDER — LISINOPRIL 20 MG PO TABS: 20.0000 mg | ORAL_TABLET | Freq: Every day | ORAL | 2 refills | Status: DC

## 2019-01-02 MED ORDER — XULTOPHY 100-3.6 UNIT-MG/ML ~~LOC~~ SOPN: 38.0000 [IU] | PEN_INJECTOR | Freq: Every day | SUBCUTANEOUS | 2 refills | Status: DC

## 2019-01-02 NOTE — Patient Instructions (Signed)
Jason Monroe, Happy Birthday!! Congratulations on improving your A1C to 6.2. That's amazing! Keep up the great work. Let's try cutting back the Xultophy to 38 units daily instead of 40. Let me know if you notice your blood sugars starting to trend back up.    For your blood pressure, I will increase your Lisinopril to 20 mg daily.  Glad to hear you were able to return to work. I know that feels good to be doing something again.   Take care, and we'll see you back in 3 months!  Dr. Koleen Distance

## 2019-01-08 ENCOUNTER — Encounter: Payer: Self-pay | Admitting: Internal Medicine

## 2019-01-08 NOTE — Assessment & Plan Note (Signed)
Patient's A1C improved from 9.1>6.2. Greatly appreciate assistance from Dr. Maudie Mercury in obtaining free medications. He has done well on Xultophy 40 units. He actually reports a couple instances of possible symptomatic hypoglycemia, but did not take his blood sugar at the time. Endorses light headedness and weakness which improved after he ate. Will cut back Xultophy to 38 units daily. He will continue monitoring CBGs and let me know if they start to trend back up.  Commended him on his success of getting A1C back to goal.

## 2019-01-08 NOTE — Assessment & Plan Note (Signed)
Patient's blood pressure elevated at 150/90 which he states has been consistent with home readings as well. He attributes this to increased stress from recently resuming work. He is willing to try increasing Lisinopril to 20 mg and will continue keeping blood pressure log at home.  Follow-up in 3 months when due for A1C. Will check BMP at this time.

## 2019-01-08 NOTE — Assessment & Plan Note (Signed)
Patient given influenza vaccine at today's visit.

## 2019-01-08 NOTE — Progress Notes (Signed)
   CC: DM, HTN  HPI:  [name] is a 48 y.o. male with PMHx listed below who presents for follow-up on DM and HTN.   Patient is overall doing quite well. He is quite relieved to finally be back at work after his prolonged recovery from split thickness skin graft done by Dr. Sharol Given. He hopes to slowly start becoming more active to get his weight back down, as he has gained approximately 50 lbs in the last year since surgery.   Past Medical History:  Diagnosis Date  . Diabetes mellitus without complication ([place])   . 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  Eyes: Negative for blurred vision.  Respiratory: Negative for cough and shortness of breath.   Cardiovascular: Negative for chest pain, palpitations and leg swelling.  Gastrointestinal: Negative for abdominal pain, constipation, diarrhea, nausea and vomiting.  Genitourinary: Negative for dysuria and frequency.  Musculoskeletal: Negative for joint pain and myalgias.  Neurological: Negative for dizziness, focal weakness and headaches.  Psychiatric/Behavioral: Negative for depression.    Physical Exam:  Vitals:   01/02/19 1451  BP: (!) 154/92  Pulse: 87  Temp: 98.4 F (36.9 C)  TempSrc: Oral  SpO2: 97%  Weight: 278 lb (126.1 kg)   Physical Exam Constitutional:      General: He is not in acute distress.    Appearance: Normal appearance.  HENT:     Mouth/Throat:     Mouth: Mucous membranes are moist.  Eyes:     Extraocular Movements: Extraocular movements intact.     Conjunctiva/sclera: Conjunctivae normal.  Neck:     Musculoskeletal: Normal range of motion and neck supple.  Cardiovascular:     Rate and Rhythm: Normal rate and regular rhythm.     Pulses: Normal pulses.  Pulmonary:     Effort: Pulmonary effort is normal.     Breath sounds: Normal breath sounds.  Abdominal:   General: Bowel sounds are normal.     Palpations: Abdomen is soft.     Tenderness: There is no abdominal tenderness.  Musculoskeletal:        General: No swelling.  Neurological:     General: No focal deficit present.     Mental Status: He is alert and oriented to person, place, and time.  Psychiatric:        Mood and Affect: Mood normal.        Behavior: Behavior normal.     Assessment & Plan:   See Encounters Tab for problem based charting.  Patient discussed with Dr. Philipp Ovens

## 2019-01-09 NOTE — Progress Notes (Signed)
Internal Medicine Clinic Attending  Case discussed with Dr. Bloomfield at the time of the visit.  We reviewed the resident's history and exam and pertinent patient test results.  I agree with the assessment, diagnosis, and plan of care documented in the resident's note.  

## 2019-01-18 ENCOUNTER — Other Ambulatory Visit: Payer: Self-pay | Admitting: Internal Medicine

## 2019-01-18 DIAGNOSIS — E1142 Type 2 diabetes mellitus with diabetic polyneuropathy: Secondary | ICD-10-CM

## 2019-02-25 IMAGING — DX DG FOOT COMPLETE 3+V*R*
3 series · 3 of 3 positions shown · non-contrast
Comparison: None.

CLINICAL DATA: Acute right foot pain.

EXAM:
RIGHT FOOT COMPLETE - 3+ VIEW

[foot ap]
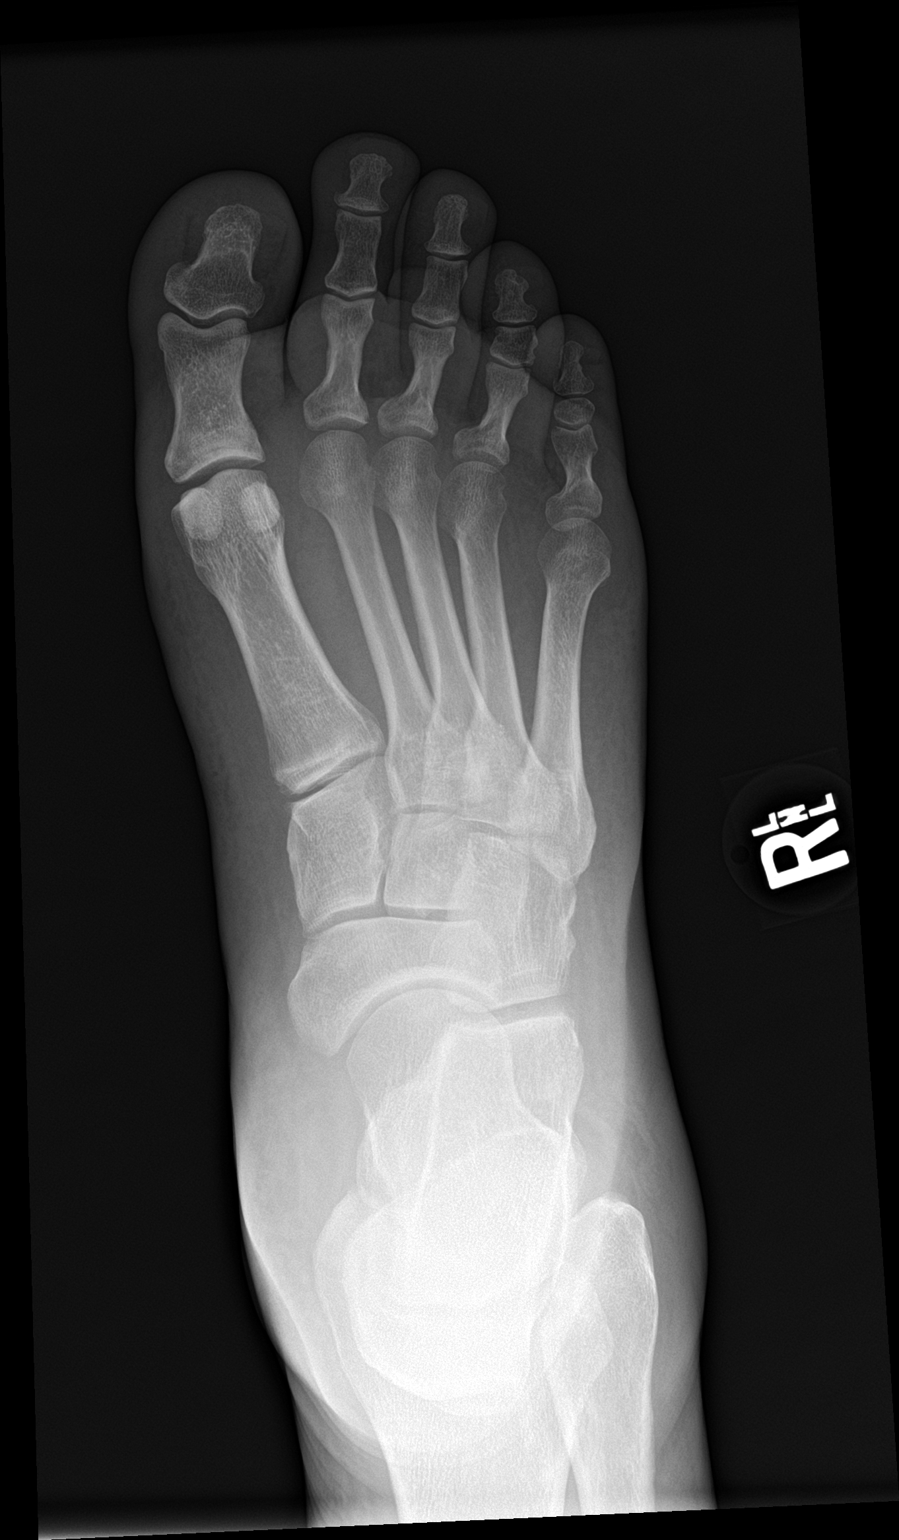

[foot obl]
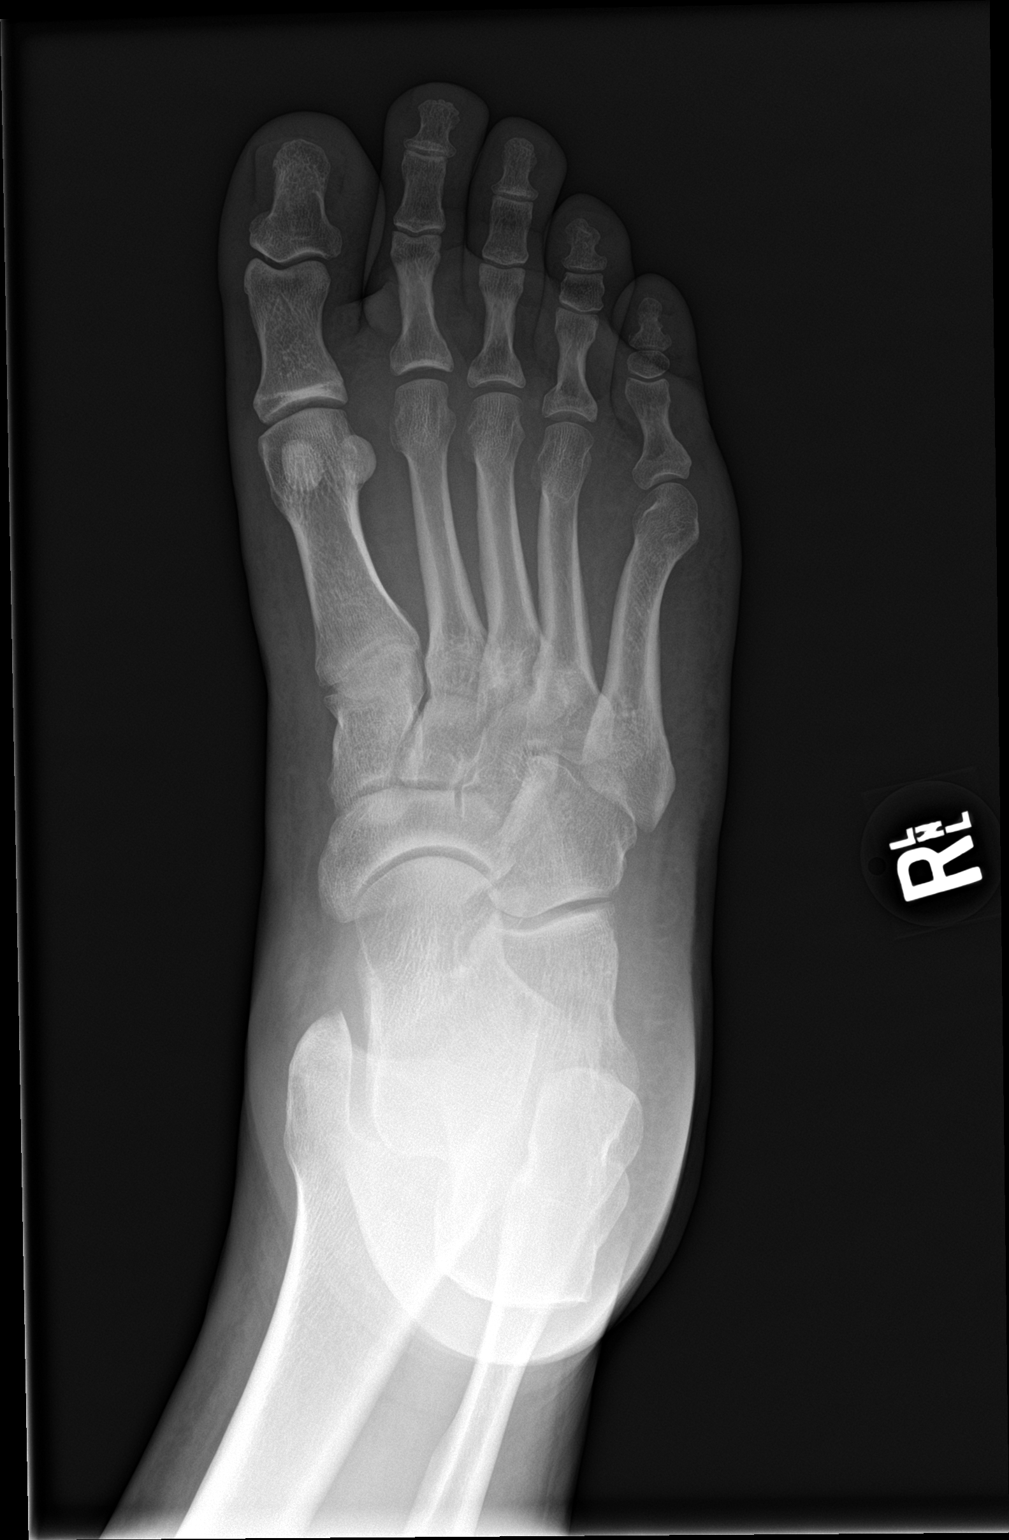

[foot lat]
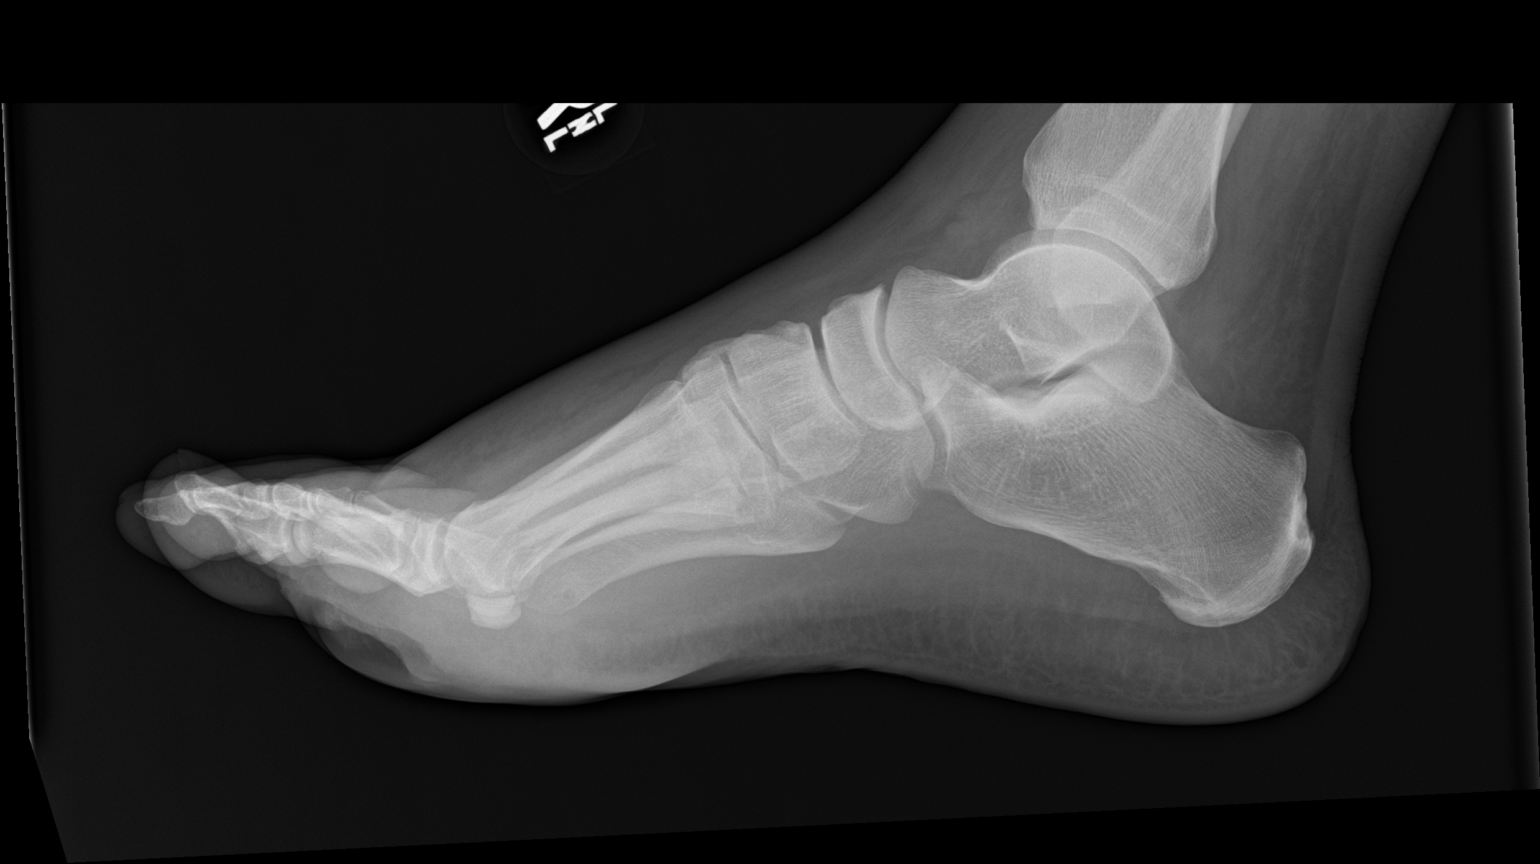

[3 of 3 positions shown; findings below may reference images not displayed]

FINDINGS: There is no evidence of fracture or dislocation. There is no
evidence of arthropathy or other focal bone abnormality. Soft
tissues are unremarkable.
IMPRESSION: Normal right foot.

## 2019-03-16 ENCOUNTER — Telehealth: Payer: Self-pay | Admitting: *Deleted

## 2019-03-16 NOTE — Telephone Encounter (Signed)
Received form from [employer] for Patient Assistance Program for [employer] 100-3.6 unit-mg/mL. Placed in PCP's box for signature. Kinnie Feil, BSN, RN-BC

## 2019-03-26 ENCOUNTER — Other Ambulatory Visit: Payer: Self-pay | Admitting: Internal Medicine

## 2019-03-26 DIAGNOSIS — E1142 Type 2 diabetes mellitus with diabetic polyneuropathy: Secondary | ICD-10-CM

## 2019-04-03 ENCOUNTER — Encounter: Payer: Self-pay | Admitting: Dietician

## 2019-04-03 ENCOUNTER — Ambulatory Visit (INDEPENDENT_AMBULATORY_CARE_PROVIDER_SITE_OTHER): Payer: Self-pay | Admitting: Internal Medicine

## 2019-04-03 ENCOUNTER — Other Ambulatory Visit: Payer: Self-pay

## 2019-04-03 ENCOUNTER — Ambulatory Visit: Payer: Self-pay | Admitting: Dietician

## 2019-04-03 VITALS — BP 184/95 | HR 72 | Temp 98.4°F | Wt 274.7 lb

## 2019-04-03 DIAGNOSIS — Z79899 Other long term (current) drug therapy: Secondary | ICD-10-CM

## 2019-04-03 DIAGNOSIS — E11621 Type 2 diabetes mellitus with foot ulcer: Secondary | ICD-10-CM

## 2019-04-03 DIAGNOSIS — Z794 Long term (current) use of insulin: Secondary | ICD-10-CM

## 2019-04-03 DIAGNOSIS — I10 Essential (primary) hypertension: Secondary | ICD-10-CM

## 2019-04-03 DIAGNOSIS — E1140 Type 2 diabetes mellitus with diabetic neuropathy, unspecified: Secondary | ICD-10-CM

## 2019-04-03 DIAGNOSIS — L97519 Non-pressure chronic ulcer of other part of right foot with unspecified severity: Secondary | ICD-10-CM

## 2019-04-03 LAB — POCT GLYCOSYLATED HEMOGLOBIN (HGB A1C): Hemoglobin A1C: 6 % — AB (ref 4.0–5.6)

## 2019-04-03 LAB — GLUCOSE, CAPILLARY: Glucose-Capillary: 95 mg/dL (ref 70–99)

## 2019-04-03 NOTE — Telephone Encounter (Signed)
Signed PAP form for Xultophy faxed to [employer] at [phone]. Kinnie Feil, BSN, RN-BC

## 2019-04-03 NOTE — Progress Notes (Signed)
Unable to obtain retinal images today. Patient reported that he had an eye exam at [facility] on [address] in August 2020 and was told that diabetes was not affecting his eyes. The report was requested.

## 2019-04-03 NOTE — Patient Instructions (Signed)
Jason Monroe, I'm so sorry to hear about your dog. I will be thinking about you, as I know how tough of a loss that is to recover from.   Your A1C continues to improve! Today it is 6.0 which is fantastic. You are doing a great job with your diet and exercises. Keep up the great work.  Your blood pressure is up today, but we will not make any changes considering the stress you are under.   Take care, and we'll see you in 3 months.   Dr. Chesley Mires

## 2019-04-04 ENCOUNTER — Encounter: Payer: Self-pay | Admitting: Internal Medicine

## 2019-04-04 LAB — BMP8+ANION GAP
Anion Gap: 14 mmol/L (ref 10.0–18.0)
BUN/Creatinine Ratio: 13 (ref 9–20)
BUN: 12 mg/dL (ref 6–24)
CO2: 25 mmol/L (ref 20–29)
Calcium: 9.3 mg/dL (ref 8.7–10.2)
Chloride: 101 mmol/L (ref 96–106)
Creatinine, Ser: 0.92 mg/dL (ref 0.76–1.27)
GFR calc Af Amer: 114 mL/min/{1.73_m2} (ref >59)
GFR calc non Af Amer: 99 mL/min/{1.73_m2} (ref >59)
Glucose: 83 mg/dL (ref 65–99)
Potassium: 4.6 mmol/L (ref 3.5–5.2)
Sodium: 140 mmol/L (ref 134–144)

## 2019-04-04 LAB — LIPID PANEL
Chol/HDL Ratio: 2.5 ratio (ref 0.0–5.0)
Cholesterol, Total: 109 mg/dL (ref 100–199)
HDL: 43 mg/dL (ref >39)
LDL Chol Calc (NIH): 53 mg/dL (ref 0–99)
Triglycerides: 60 mg/dL (ref 0–149)
VLDL Cholesterol Cal: 13 mg/dL (ref 5–40)

## 2019-04-04 NOTE — Assessment & Plan Note (Signed)
Patient has done remarkably well with initiation of Xultophy and lifestyle modifications with diet and exercise. A1C is 6.0 today which I greatly commended him on. He endorses he is feeling much better, has more energy, and neuropathy symptoms have significantly improved. He denies any symptomatic hypoglycemic events so will continue with 38 units daily.  - he is not currently on a statin. His LDL is 53, HDL 49. No urgent indication to start statin based on lipid panel and A1C at goal, but will discuss initiation at next visit given his increased risk of CV event with diabetes.

## 2019-04-04 NOTE — Assessment & Plan Note (Signed)
Blood pressure elevated today, but he admits to being in significant distress over loss of his dog today. His blood pressure at home tends to fluctuate based on work stress, but predominantly trend at goal. I will hold off on adding additional therapy for now, but will plan to add combination of HCTZ to lisinopril at next visit if he remains elevated. He has some trace edema on exam so this would be preferred over amlodipine. BMP today with stable renal function and electrolytes.

## 2019-04-04 NOTE — Progress Notes (Signed)
   CC: DM, HTN  HPI:  [name] is a 49 y.o. gentleman with insulin-dependent type II DM, HTN who presents for follow-up on his chronic medical conditions. Please see problem based charting for further details.   Past Medical History:  Diagnosis Date  . Diabetes mellitus without complication ([facility])   . 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  HENT: Negative for congestion and sore throat.   Eyes: Negative for pain and discharge.  Respiratory: Negative for sputum production.   Cardiovascular: Negative for chest pain, palpitations and leg swelling.  Gastrointestinal: Negative for diarrhea, nausea and vomiting.  Genitourinary: Negative for frequency.  Musculoskeletal: Negative for joint pain and myalgias.  Neurological: Negative for dizziness, sensory change, loss of consciousness and headaches.  Endo/Heme/Allergies: Negative for polydipsia.  Psychiatric/Behavioral: Negative for depression.    Physical Exam:  Vitals:   04/03/19 1534  BP: (!) 184/95  Pulse: 72  Temp: 98.4 F (36.9 C)  TempSrc: Oral  SpO2: 98%  Weight: 274 lb 11.2 oz (124.6 kg)   Physical Exam Constitutional:      General: He is not in acute distress.    Appearance: Normal appearance.  Eyes:     Conjunctiva/sclera: Conjunctivae normal.  Cardiovascular:     Rate and Rhythm: Normal rate and regular rhythm.     Pulses: Normal pulses.  Pulmonary:     Effort: Pulmonary effort is normal.     Breath sounds: Normal breath sounds.  Abdominal:     General: There is no distension.     Palpations: Abdomen is soft.     Tenderness: There is no abdominal tenderness.  Musculoskeletal:     Cervical back: Neck supple.     Right lower leg: Edema present.     Left lower leg: Edema present.  Skin:    General: Skin is warm and dry.  Neurological:     General: No focal deficit present.      Mental Status: He is alert and oriented to person, place, and time.  Psychiatric:        Mood and Affect: Mood normal.        Behavior: Behavior normal.      Assessment & Plan:   See Encounters Tab for problem based charting.  Patient discussed with Dr. Mikey Bussing

## 2019-04-07 ENCOUNTER — Other Ambulatory Visit: Payer: Self-pay | Admitting: Internal Medicine

## 2019-04-09 NOTE — Progress Notes (Signed)
Internal Medicine Clinic Attending  Case discussed with Dr. Bloomfield at the time of the visit.  We reviewed the resident's history and exam and pertinent patient test results.  I agree with the assessment, diagnosis, and plan of care documented in the resident's note.  

## 2019-05-09 ENCOUNTER — Other Ambulatory Visit: Payer: Self-pay | Admitting: Internal Medicine

## 2019-05-09 DIAGNOSIS — E1142 Type 2 diabetes mellitus with diabetic polyneuropathy: Secondary | ICD-10-CM

## 2019-05-23 ENCOUNTER — Telehealth: Payer: Self-pay | Admitting: *Deleted

## 2019-05-23 NOTE — Telephone Encounter (Signed)
Pt will be out of xultophy at the end of the week, dr kim had told the pt's mother that she was having trulicity sent to him by mail, she has a big box- 16 pens and wants to know if he should start the trulicity in place of the xultophpy. Please advise Shipment came from [facility] in [place]. Mother ph# [phone] cora

## 2019-05-24 NOTE — Telephone Encounter (Signed)
The last note I see from Dr. Selena Batten says that he was able to get Xultophy for free and this was to simplify his regimen. If he is unable to get this any longer, he will need to use insulin in addition to Trulicity or Victoza like he was before.

## 2019-05-29 ENCOUNTER — Other Ambulatory Visit: Payer: Self-pay | Admitting: Internal Medicine

## 2019-05-29 ENCOUNTER — Telehealth: Payer: Self-pay

## 2019-05-29 DIAGNOSIS — E11621 Type 2 diabetes mellitus with foot ulcer: Secondary | ICD-10-CM

## 2019-05-29 DIAGNOSIS — L97519 Non-pressure chronic ulcer of other part of right foot with unspecified severity: Secondary | ICD-10-CM

## 2019-05-29 MED ORDER — INSULIN ASPART PROT & ASPART (70-30 MIX) 100 UNIT/ML ~~LOC~~ SUSP: 20.0000 [IU] | Freq: Two times a day (BID) | SUBCUTANEOUS | 3 refills | Status: DC

## 2019-05-29 MED ORDER — INSULIN ASPART PROT & ASPART (70-30 MIX) 100 UNIT/ML PEN: 20.0000 [IU] | PEN_INJECTOR | Freq: Two times a day (BID) | SUBCUTANEOUS | 11 refills | Status: DC

## 2019-05-29 NOTE — Telephone Encounter (Signed)
Thank you for calling Jason Monroe. I have sent in a new Rx for the vials.

## 2019-05-29 NOTE — Telephone Encounter (Signed)
Spoke to pt's mother, went over dosing of trulicity and novolog 70/30. She repeated back instructions. Pt cannot afford the pens of 70/30 will need vials could you send a new script?

## 2019-05-29 NOTE — Telephone Encounter (Signed)
Received TC from patient's mother, asking if her son is supposed to take Trulicity now?  Dr. Dorothyann Peng instructions read to mother.  Mother states, they have received Xultophy in the past, but have not received any additional Xultophy, they only have Trulicity.  Mother needs to know how much Trulicity he should take and how much insulin he needs to take.  Please update med list/dosage instructions. Dr. Chesley Mires, would you like to refer pt to  [facility]?  Since pt was originally approved for Xultophy, maybe they can assist with getting this back on board for him? Thank you, Skip Estimable, RN,BSN

## 2019-05-29 NOTE — Progress Notes (Signed)
Orders sent for chronic care management services and insulin 70/30 in the mean time.  Patient will need to take 20 units twice daily in addition to trulicity 1.5 mg once weekly.

## 2019-05-29 NOTE — Telephone Encounter (Signed)
Opened in error. SChaplin, RN,BSN  

## 2019-05-29 NOTE — Telephone Encounter (Signed)
Thank you for that idea, Stacee! I have placed referral. I have also sent in more 70/30 insulin like he was previously on. He will need to take 20 units BID in addition to 1.5 mg weekly of Trulicity. Hopefully we can get him back on Xultophy through medication assistance.

## 2019-05-29 NOTE — Telephone Encounter (Signed)
RTC, VM box full and RN unable to leave message. SChaplin, RN,BSN

## 2019-06-01 ENCOUNTER — Ambulatory Visit: Payer: Self-pay | Admitting: *Deleted

## 2019-06-01 NOTE — Chronic Care Management (AMB) (Signed)
  Chronic Care Management   Note  06/01/2019 Name: Jason Monroe MRN: [id number] DOB: April 16, 1970  [facility] referral for assistance with Xultophy sent.   Follow up plan: [facility] follow up with direct communication to provider.    Marja Kays MHA,BSN,RN,CCM [facility]  [facility] Care Management Coordinator Direct Dial:  [phone]  Fax: [phone]

## 2019-06-05 ENCOUNTER — Telehealth: Payer: Self-pay

## 2019-06-05 NOTE — Telephone Encounter (Signed)
Hi Stacee, I got this staff message from [facility]. I looked in my mailbox and did not see the form. I have made Jason Monroe with [facility] aware and she is looking into it. If you have seen this form, please let me know!   Thanks, Dr. Chesley Mires

## 2019-06-05 NOTE — Telephone Encounter (Signed)
Received TC from Tiffany at [employer] asking if Dr. Chesley Mires received a form she faxed to her on Thursday at 5pm regarding patient application program for Xultophy.  She states Md sent a referral to [facility] for assistance with Xultophy and that patient was already approved for Xultophy for 08/2018 - 08/2019.  States the form she faxed needs to be filled out and she can assist with this, will in-basket Dr. Chesley Mires. Dr. Chesley Mires, if you would like triage to assist, please let us know. Thanks, SChaplin, RN,BSN

## 2019-06-14 ENCOUNTER — Other Ambulatory Visit: Payer: Self-pay | Admitting: Internal Medicine

## 2019-06-22 ENCOUNTER — Telehealth: Payer: Self-pay | Admitting: Dietician

## 2019-06-22 NOTE — Telephone Encounter (Signed)
Patient's mother informed that xultophy and pen needles are here for them to pick up and how to safely transition from his current insulin and GLP-1i  back to xultophy. She says they will pick it up Monday.

## 2019-06-23 NOTE — Telephone Encounter (Signed)
I did not ask as I was speaking to his mother. I can put whatever dose you think appropriate on the directions of the xultophy they are to pick up.

## 2019-06-23 NOTE — Telephone Encounter (Signed)
Yes that would be great! Is he planning on resuming 40 units daily that he was on before?

## 2019-07-04 ENCOUNTER — Other Ambulatory Visit: Payer: Self-pay | Admitting: Internal Medicine

## 2019-07-04 DIAGNOSIS — E1142 Type 2 diabetes mellitus with diabetic polyneuropathy: Secondary | ICD-10-CM

## 2019-07-05 ENCOUNTER — Encounter: Payer: Self-pay | Admitting: Internal Medicine

## 2019-07-17 ENCOUNTER — Other Ambulatory Visit: Payer: Self-pay | Admitting: Internal Medicine

## 2019-07-26 ENCOUNTER — Ambulatory Visit (INDEPENDENT_AMBULATORY_CARE_PROVIDER_SITE_OTHER): Payer: Self-pay | Admitting: Internal Medicine

## 2019-07-26 ENCOUNTER — Other Ambulatory Visit: Payer: Self-pay

## 2019-07-26 ENCOUNTER — Encounter: Payer: Self-pay | Admitting: Internal Medicine

## 2019-07-26 VITALS — BP 145/84 | HR 75 | Temp 98.1°F | Ht 72.0 in | Wt 271.1 lb

## 2019-07-26 DIAGNOSIS — E11621 Type 2 diabetes mellitus with foot ulcer: Secondary | ICD-10-CM

## 2019-07-26 DIAGNOSIS — Z794 Long term (current) use of insulin: Secondary | ICD-10-CM

## 2019-07-26 DIAGNOSIS — I10 Essential (primary) hypertension: Secondary | ICD-10-CM

## 2019-07-26 DIAGNOSIS — L97519 Non-pressure chronic ulcer of other part of right foot with unspecified severity: Secondary | ICD-10-CM

## 2019-07-26 LAB — POCT GLYCOSYLATED HEMOGLOBIN (HGB A1C): Hemoglobin A1C: 6 % — AB (ref 4.0–5.6)

## 2019-07-26 LAB — GLUCOSE, CAPILLARY: Glucose-Capillary: 152 mg/dL — ABNORMAL HIGH (ref 70–99)

## 2019-07-26 MED ORDER — LISINOPRIL-HYDROCHLOROTHIAZIDE 20-25 MG PO TABS: 1.0000 | ORAL_TABLET | Freq: Every day | ORAL | 2 refills | Status: DC

## 2019-07-26 MED FILL — LISINOPRIL-HCTZ 20-25 MG TA: 20-25 | 30 days supply | Qty: 30 | Fill #0

## 2019-07-26 NOTE — Progress Notes (Signed)
Established Patient Office Visit  Subjective:  Patient ID: Jason Monroe, male    DOB: Dec 22, 1970  Age: 49 y.o. MRN: [id number]  CC: DM, HTN   HPI YASSER HEPP presents for follow-up on chronic type II DM, HTN. Please see problem based charting for further details.   Past Medical History:  Diagnosis Date  . Diabetes mellitus without complication ([place])   . 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 I & D EXTREMITY Right 10/24/2017   Procedure: IRRIGATION AND DEBRIDEMENT OF FOOT;  Surgeon: Newt Minion, MD;  Location: [place];  Service: Orthopedics;  Laterality: Right;  . I & D EXTREMITY Right 10/27/2017   Procedure: DEBRIDEMENT AND SKIN GRAFT RIGHT FOOT;  Surgeon: Newt Minion, MD;  Location: [place];  Service: Orthopedics;  Laterality: Right;    Family History  Problem Relation Age of Onset  . Arrhythmia Sister   . Diabetes type I Maternal Grandmother     Social History   Socioeconomic History  . Marital status: Single    Spouse name: Not on file  . Number of children: Not on file  . Years of education: Not on file  . Highest education level: Not on file  Occupational History  . Not on file  Tobacco Use  . Smoking status: Former Smoker    Types: Pipe  . Smokeless tobacco: Never Used  . Tobacco comment: quit 86yr ago  Substance and Sexual Activity  . Alcohol use: Not Currently  . Drug use: No  . Sexual activity: Not on file  Other Topics Concern  . Not on file  Social History Narrative  . Not on file   Social Determinants of Health   Financial Resource Strain:   . Difficulty of Paying Living Expenses:   Food Insecurity:   . Worried About RCharity fundraiserin the Last Year:   . RArboriculturistin the Last Year:   Transportation Needs:   . LFilm/video editor(Medical):   .Marland KitchenLack of Transportation (Non-Medical):   Physical Activity:   . Days of Exercise per Week:   .  Minutes of Exercise per Session:   Stress:   . Feeling of Stress :   Social Connections:   . Frequency of Communication with Friends and Family:   . Frequency of Social Gatherings with Friends and Family:   . Attends Religious Services:   . Active Member of Clubs or Organizations:   . Attends CArchivistMeetings:   .Marland KitchenMarital Status:   Intimate Partner Violence:   . Fear of Current or Ex-Partner:   . Emotionally Abused:   .Marland KitchenPhysically Abused:   . Sexually Abused:     Outpatient Medications Prior to Visit  Medication Sig Dispense Refill  . Insulin Degludec-Liraglutide (XULTOPHY) 100-3.6 UNIT-MG/ML SOPN Inject 40 Units into the skin daily.    . blood glucose meter kit and supplies KIT Dispense based on patient and insurance preference. Use up to four times daily as directed. (FOR ICD-9 250.00, 250.01). 1 each 0  . gabapentin (NEURONTIN) 300 MG capsule TAKE 1 CAPSULE BY MOUTH THREE TIMES DAILY 90 capsule 5  . ibuprofen (ADVIL,MOTRIN) 200 MG tablet Take 600 mg by mouth every 12 (twelve) hours.     . Insulin Pen Needle (PEN NEEDLES) 31G X 5 MM MISC 1 Units by Does not apply route 2 (two) times daily.  100 each 0  . Insulin Syringes, Disposable, U-100 0.3 ML MISC 13 units of novolin at breakfast and dinner 100 each 0  . silver sulfADIAZINE (SILVADENE) 1 % cream Apply 1 application topically daily. Apply to right foot daily after gentle cleaning 400 g 0  . insulin aspart protamine- aspart (NOVOLOG MIX 70/30) (70-30) 100 UNIT/ML injection Inject 0.2 mLs (20 Units total) into the skin 2 (two) times daily with a meal. 10 mL 3  . lisinopril (ZESTRIL) 20 MG tablet Take 1 tablet by mouth once daily 90 tablet 0   No facility-administered medications prior to visit.    Allergies  Allergen Reactions  . Metformin And Related Hives    ROS Review of Systems  Constitutional: Negative for activity change, chills, fever and unexpected weight change.  HENT: Negative for hearing loss.    Eyes: Negative for visual disturbance.  Respiratory: Negative for cough and shortness of breath.   Cardiovascular: Negative for chest pain and palpitations.  Gastrointestinal: Negative for abdominal pain, constipation, diarrhea and nausea.  Endocrine: Negative for polydipsia, polyphagia and polyuria.  Genitourinary: Negative for dysuria.  Musculoskeletal: Negative for arthralgias and myalgias.  Skin: Negative for wound.  Neurological: Negative for dizziness and headaches.  Psychiatric/Behavioral: Negative for sleep disturbance.      Objective:    Physical Exam  Constitutional: He is oriented to person, place, and time. He appears well-developed and well-nourished. No distress.  Eyes: Conjunctivae are normal.  Neck: No thyromegaly present.  Cardiovascular: Normal rate and regular rhythm.  Pulmonary/Chest: Effort normal and breath sounds normal.  Abdominal: Soft. Bowel sounds are normal. He exhibits no distension. There is no abdominal tenderness.  Musculoskeletal:        General: Edema (trace edema BLE) present.     Cervical back: Neck supple.  Lymphadenopathy:    He has no cervical adenopathy.  Neurological: He is alert and oriented to person, place, and time.  Skin: Skin is warm and dry.  Psychiatric: He has a normal mood and affect. His behavior is normal.    BP (!) 145/84 (BP Location: Left Arm, Cuff Size: Large)   Pulse 75   Temp 98.1 F (36.7 C) (Oral)   Ht 6' (1.829 m)   Wt 271 lb 1.6 oz (123 kg)   SpO2 98% Comment: room air  BMI 36.77 kg/m  Wt Readings from Last 3 Encounters:  07/26/19 271 lb 1.6 oz (123 kg)  04/03/19 274 lb 11.2 oz (124.6 kg)  01/02/19 278 lb (126.1 kg)     Health Maintenance Due  Topic Date Due  . PNEUMOCOCCAL POLYSACCHARIDE VACCINE AGE 5-64 HIGH RISK  Never done  . COVID-19 Vaccine (1) Never done  . TETANUS/TDAP  Never done  . FOOT EXAM  04/26/2019    There are no preventive care reminders to display for this patient.  No results  found for: TSH Lab Results  Component Value Date   WBC 13.9 (H) 10/28/2017   HGB 11.3 (L) 10/28/2017   HCT 34.2 (L) 10/28/2017   MCV 90.2 10/28/2017   PLT 404 (H) 10/28/2017   Lab Results  Component Value Date   NA 140 04/03/2019   K 4.6 04/03/2019   CO2 25 04/03/2019   GLUCOSE 83 04/03/2019   BUN 12 04/03/2019   CREATININE 0.92 04/03/2019   BILITOT 1.4 (H) 10/23/2017   ALKPHOS 101 10/23/2017   AST 22 10/23/2017   ALT 22 10/23/2017   PROT 7.6 10/23/2017   ALBUMIN 2.9 (L) 10/23/2017  CALCIUM 9.3 04/03/2019   ANIONGAP 8 10/28/2017   Lab Results  Component Value Date   CHOL 109 04/03/2019   Lab Results  Component Value Date   HDL 43 04/03/2019   Lab Results  Component Value Date   LDLCALC 53 04/03/2019   Lab Results  Component Value Date   TRIG 60 04/03/2019   Lab Results  Component Value Date   CHOLHDL 2.5 04/03/2019   Lab Results  Component Value Date   HGBA1C 6.0 (A) 07/26/2019      Assessment & Plan:   Problem List Items Addressed This Visit      Cardiovascular and Mediastinum   Essential hypertension    Patient's blood pressure elevated again today. He endorses a lot of stress at work lately. He is asymptomatic. I think it is reasonable to add HCTZ 25 mg to his Lisinopril 20 mg as a combination pill to get him closer to goal of <130/80. Re-check BMP at next visit.       Relevant Medications   lisinopril-hydrochlorothiazide (ZESTORETIC) 20-25 MG tablet     Endocrine   Type 2 diabetes mellitus with right diabetic foot ulcer ([place]) - Primary    Patient continues to do quite well on Xultophy 40 units daily. No symptomatic hypoglycemic events. A1C remains at 6.0. Will continue current regimen. No indication for statin based on recent lipid panel. He will need eye exam which we will coordinate with Butch Penny at the next visit.       Relevant Medications   lisinopril-hydrochlorothiazide (ZESTORETIC) 20-25 MG tablet   Insulin Degludec-Liraglutide  (XULTOPHY) 100-3.6 UNIT-MG/ML SOPN   Other Relevant Orders   POC Hbg A1C (Completed)      Meds ordered this encounter  Medications  . DISCONTD: lisinopril-hydrochlorothiazide (ZESTORETIC) 20-25 MG tablet    Sig: Take 1 tablet by mouth daily.    Dispense:  30 tablet    Refill:  2  . lisinopril-hydrochlorothiazide (ZESTORETIC) 20-25 MG tablet    Sig: Take 1 tablet by mouth daily.    Dispense:  30 tablet    Refill:  2    IM program    Follow-up: Return in about 3 months (around 10/26/2019) for DM, HTN.    Delice Bison, DO

## 2019-07-26 NOTE — Patient Instructions (Signed)
Jason Monroe, It was a pleasure seeing you today!  Today we discussed:   1. Your diabetes: your A1C continues to look great! 6.0 again today, keep up the great work!  2. Your blood pressure: I am starting a combination pill that should get you to a goal of less than 130/80. I have sent this to our outpatient pharmacy.   Try keeping your callus filed down even with the skin to avoid any risk of causing issues.   Take care, and I'll see you in 3 months!  Dr. Chesley Mires

## 2019-07-27 ENCOUNTER — Encounter: Payer: Self-pay | Admitting: Internal Medicine

## 2019-07-27 NOTE — Assessment & Plan Note (Signed)
Patient's blood pressure elevated again today. He endorses a lot of stress at work lately. He is asymptomatic. I think it is reasonable to add HCTZ 25 mg to his Lisinopril 20 mg as a combination pill to get him closer to goal of <130/80. Re-check BMP at next visit.

## 2019-07-27 NOTE — Progress Notes (Signed)
Internal Medicine Clinic Attending  Case discussed with Dr. Bloomfield at the time of the visit.  We reviewed the resident's history and exam and pertinent patient test results.  I agree with the assessment, diagnosis, and plan of care documented in the resident's note.  

## 2019-07-27 NOTE — Assessment & Plan Note (Signed)
Patient continues to do quite well on Xultophy 40 units daily. No symptomatic hypoglycemic events. A1C remains at 6.0. Will continue current regimen. No indication for statin based on recent lipid panel. He will need eye exam which we will coordinate with [name] at the next visit.

## 2019-08-24 MED FILL — LISINOPRIL-HCTZ 20-25 MG TA: 20-25 | 30 days supply | Qty: 30 | Fill #1

## 2019-09-26 MED FILL — LISINOPRIL-HCTZ 20-25 MG TA: 20-25 | 30 days supply | Qty: 30 | Fill #2

## 2019-10-09 ENCOUNTER — Other Ambulatory Visit: Payer: Self-pay

## 2019-10-09 ENCOUNTER — Ambulatory Visit (INDEPENDENT_AMBULATORY_CARE_PROVIDER_SITE_OTHER): Payer: Managed Care, Other (non HMO) | Admitting: Internal Medicine

## 2019-10-09 ENCOUNTER — Encounter: Payer: Self-pay | Admitting: Internal Medicine

## 2019-10-09 VITALS — BP 115/78 | HR 81 | Temp 98.1°F | Ht 72.0 in | Wt 270.7 lb

## 2019-10-09 DIAGNOSIS — L97519 Non-pressure chronic ulcer of other part of right foot with unspecified severity: Secondary | ICD-10-CM | POA: Diagnosis not present

## 2019-10-09 DIAGNOSIS — N529 Male erectile dysfunction, unspecified: Secondary | ICD-10-CM | POA: Diagnosis not present

## 2019-10-09 DIAGNOSIS — E11621 Type 2 diabetes mellitus with foot ulcer: Secondary | ICD-10-CM

## 2019-10-09 DIAGNOSIS — Z1159 Encounter for screening for other viral diseases: Secondary | ICD-10-CM | POA: Diagnosis not present

## 2019-10-09 LAB — POCT GLYCOSYLATED HEMOGLOBIN (HGB A1C): Hemoglobin A1C: 6.7 % — AB (ref 4.0–5.6)

## 2019-10-09 LAB — GLUCOSE, CAPILLARY: Glucose-Capillary: 171 mg/dL — ABNORMAL HIGH (ref 70–99)

## 2019-10-09 NOTE — Patient Instructions (Addendum)
  Thank you for trusting me with your care. To recap, today we discussed the following:   1. Type 2 diabetes mellitus with right diabetic foot ulcer (HCC) - referral to opthalmology - referral to podiatry  - POC Hbg A1C ( 6.7) - Continue Xultophy  I will set up some lab work to check your testosterone.    My best,  Thurmon Fair, MD

## 2019-10-09 NOTE — Progress Notes (Signed)
   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 male who presents for evaluation of type 2 diabetes and erectile dysfuction. Please see individual problem based A/P for details.  Depression, PHQ-9: Based on the patients    Office Visit from 10/09/2019 in [facility]  PHQ-9 Total Score 1      Past Medical History:  Diagnosis Date  . Diabetes mellitus without complication (HCC)   . 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    Physical Exam: Vitals:   10/09/19 1033  BP: 115/78  Pulse: 81  Temp: 98.1 F (36.7 C)  TempSrc: Oral  SpO2: 99%  Weight: 270 lb 11.2 oz (122.8 kg)  Height: 6' (1.829 m)    General: NAD, nl appearance HE: Normocephalic, atraumatic , EOMI, Conjunctivae normal ENT: No congestion, no rhinorrhea, no exudate or erythema  Cardiovascular: Normal rate, regular rhythm.  No murmurs, rubs, or gallops Pulmonary : Effort normal, breath sounds normal. No wheezes, rales, or rhonchi Abdominal: soft, nontender,  bowel sounds present Ext: :See picture below of right foot and chronic ulcer with escar. No drainage , fluctuation, or erythema     Assessment & Plan:   See Encounters Tab for problem based charting.  Patient discussed with Dr. Cleda Daub

## 2019-10-10 ENCOUNTER — Encounter: Payer: Self-pay | Admitting: Internal Medicine

## 2019-10-10 DIAGNOSIS — N529 Male erectile dysfunction, unspecified: Secondary | ICD-10-CM | POA: Insufficient documentation

## 2019-10-10 NOTE — Addendum Note (Signed)
Addended by: Bufford Spikes on: 10/10/2019 04:32 PM   Modules accepted: Orders

## 2019-10-10 NOTE — Assessment & Plan Note (Signed)
Assessment: DMII: Hgb A1c 6 % at the last visit. Current A1c 6.7%. Average glucometer reading 130 over the past 30 days with a high of 200 and low of 90 which was not symptomatic. The patient denied polyuria, polydipsia, headache, fatigue, confusion, nausea, vomiting or diaphoresis. We discussed switching to oral medication regemin, but given he is well controlled on one shot a day I think this is the best option.   He doesn't like having to check hi blood glucose regularly and is interested in continuous blood glucose monitoring. He expresses motivation to get off of all medications for DM through diet and exercise.   Plan: Continue Xultophy 40 units daily Repeat A1c at next visit if after 3 months from this date Referral to Podiatry Referral to Opthalmology Referral to Nutrition and Diabetic Services  , patient also interested in continuous glucose monitoring

## 2019-10-10 NOTE — Assessment & Plan Note (Signed)
Patient reports ED since 2019. He says he still has a good libido , but unable to obtain an erection. He has attempted sexual intercourse and unable to obtain an erection. He says this is very important to him and causing him stress. He asked about testing him for low testosterone as his twin brother was diagnosed with low testosterone.We discussed his ED is likely secondary to his DM given he has peripheral neuropathy and started when he had a history of uncontrolled diabetes. We will check his testosterone. Depending on his results he would need follow up testing of Free testosterone x2 to make the diagnosis. If his testosterone is normal we discussed PDE5 inhibitors. He reports a stable mood and given long history I do not believe this is psychogenic. He does not have morning erections.  Plan: CMP Testosterone TSH This labs have been put in , patient need Testosterone drawn between 830-10 am

## 2019-10-11 NOTE — Progress Notes (Signed)
Internal Medicine Clinic Attending  Case discussed with Dr. Steen  At the time of the visit.  We reviewed the resident's history and exam and pertinent patient test results.  I agree with the assessment, diagnosis, and plan of care documented in the resident's note.  

## 2019-10-12 ENCOUNTER — Other Ambulatory Visit: Payer: Self-pay | Admitting: Dietician

## 2019-10-12 DIAGNOSIS — E11621 Type 2 diabetes mellitus with foot ulcer: Secondary | ICD-10-CM

## 2019-10-12 NOTE — Telephone Encounter (Signed)
Called Jason Monroe to find out which Continuous glucose monitor(CGM) he prefers. He wants to use his phone if possible as the reader for the Freestyle Libre 14 day CGM. Request prescription for sensor be sent to [facility].

## 2019-10-13 ENCOUNTER — Telehealth: Payer: Self-pay | Admitting: Internal Medicine

## 2019-10-13 MED ORDER — FREESTYLE LIBRE 14 DAY READER DEVI: 0 refills | Status: DC

## 2019-10-13 MED ORDER — FREESTYLE LIBRE 14 DAY SENSOR MISC: 11 refills | Status: DC

## 2019-10-13 NOTE — Addendum Note (Signed)
Addended by: Remo Lipps on: 10/13/2019 01:14 PM   Modules accepted: Orders

## 2019-10-13 NOTE — Telephone Encounter (Signed)
Called pharmacy and they do not have a prescription for the Freestyle Libre CGM 14 day sensors. Please send prescription for 2 sensors per month with 11 refills for Freestyle Libre CGM 14 day sensors to [facility] at [employer]. Thank you!

## 2019-10-16 ENCOUNTER — Encounter: Payer: Self-pay | Admitting: Internal Medicine

## 2019-10-16 ENCOUNTER — Telehealth: Payer: Self-pay | Admitting: Dietician

## 2019-10-16 NOTE — Telephone Encounter (Signed)
Jason Monroe left a voicemail about setting up an appointment to learn how to use the [employer] 14 day CGM. I called his pharmacy and they do not have his insurance card so sensors are $72.28 and reader is $64.99.  Left Jason Monroe a message for return call to explain the above and ask him about scheduling an appointment.

## 2019-10-18 ENCOUNTER — Encounter: Payer: Self-pay | Admitting: *Deleted

## 2019-10-18 NOTE — Telephone Encounter (Signed)
Left voicemail for return call about Continuous glucose monitor and an appointment for training.

## 2019-10-20 NOTE — Telephone Encounter (Signed)
Spoke with Mr. Reesman mother since he was not available who took a message.

## 2019-11-01 NOTE — Telephone Encounter (Signed)
Opened in mistake

## 2019-11-13 ENCOUNTER — Ambulatory Visit (INDEPENDENT_AMBULATORY_CARE_PROVIDER_SITE_OTHER): Payer: Managed Care, Other (non HMO)

## 2019-11-13 ENCOUNTER — Ambulatory Visit (INDEPENDENT_AMBULATORY_CARE_PROVIDER_SITE_OTHER): Payer: Managed Care, Other (non HMO) | Admitting: Podiatry

## 2019-11-13 ENCOUNTER — Other Ambulatory Visit: Payer: Self-pay

## 2019-11-13 DIAGNOSIS — E1149 Type 2 diabetes mellitus with other diabetic neurological complication: Secondary | ICD-10-CM | POA: Diagnosis not present

## 2019-11-13 DIAGNOSIS — L84 Corns and callosities: Secondary | ICD-10-CM | POA: Diagnosis not present

## 2019-11-13 DIAGNOSIS — M216X1 Other acquired deformities of right foot: Secondary | ICD-10-CM

## 2019-11-13 NOTE — Progress Notes (Signed)
Subjective:   Patient ID: Jason Monroe, male   DOB: 49 y.o.   MRN: [id number]   HPI 49 year old male presents the office today for concerns of a callus, scab on his right foot submetatarsal 2 area.  He states that in 2019 he had an ulcer, infection and had surgery in the bottom of the foot.  Since then he has had this callus on the bottom.  He denies any recent drainage or purulence.  He denies any swelling.  He is on his feet more at work.  His blood sugar is better controlled.  He does have neuropathy.  He has no other concerns today.  Last A1c 6.7 on 10/09/2019  Review of Systems  All other systems reviewed and are negative.  Past Medical History:  Diagnosis Date  . Diabetes mellitus without complication ([place])   . 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 I & D EXTREMITY Right 10/24/2017   Procedure: IRRIGATION AND DEBRIDEMENT OF FOOT;  Surgeon: Newt Minion, MD;  Location: [place];  Service: Orthopedics;  Laterality: Right;  . I & D EXTREMITY Right 10/27/2017   Procedure: DEBRIDEMENT AND SKIN GRAFT RIGHT FOOT;  Surgeon: Newt Minion, MD;  Location: [place];  Service: Orthopedics;  Laterality: Right;     Current Outpatient Medications:  .  blood glucose meter kit and supplies KIT, Dispense based on patient and insurance preference. Use up to four times daily as directed. (FOR ICD-9 250.00, 250.01)., Disp: 1 each, Rfl: 0 .  Continuous Blood Gluc Receiver (FREESTYLE LIBRE 14 DAY READER) DEVI, Check blood sugar 6 times a day, Disp: 1 each, Rfl: 0 .  Continuous Blood Gluc Sensor (FREESTYLE LIBRE 14 DAY SENSOR) MISC, Check blood sugar 6 times a day, Disp: 2 each, Rfl: 11 .  gabapentin (NEURONTIN) 300 MG capsule, TAKE 1 CAPSULE BY MOUTH THREE TIMES DAILY, Disp: 90 capsule, Rfl: 5 .  ibuprofen (ADVIL,MOTRIN) 200 MG tablet, Take 600 mg by mouth every 12 (twelve) hours. , Disp: , Rfl:  .  Insulin Degludec-Liraglutide  (XULTOPHY) 100-3.6 UNIT-MG/ML SOPN, Inject 40 Units into the skin daily., Disp: , Rfl:  .  Insulin Pen Needle (PEN NEEDLES) 31G X 5 MM MISC, 1 Units by Does not apply route 2 (two) times daily., Disp: 100 each, Rfl: 0 .  Insulin Syringes, Disposable, U-100 0.3 ML MISC, 13 units of novolin at breakfast and dinner, Disp: 100 each, Rfl: 0 .  lisinopril-hydrochlorothiazide (ZESTORETIC) 20-25 MG tablet, Take 1 tablet by mouth daily., Disp: 30 tablet, Rfl: 2 .  silver sulfADIAZINE (SILVADENE) 1 % cream, Apply 1 application topically daily. Apply to right foot daily after gentle cleaning, Disp: 400 g, Rfl: 0  Allergies  Allergen Reactions  . Metformin And Related Hives         Objective:  Physical Exam  General: AAO x3, NAD  Dermatological: On the right foot submetatarsal 2 area is a large hyperkeratotic lesion.  There are some dried blood present.  Upon debridement there is no ongoing ulceration drainage or any signs of infection.  There is Erythema, ascending cellulitis.  There is no fluctuation or crepitation.  There is no malodor.  There are no open lesions.  Vascular: Dorsalis Pedis artery and Posterior Tibial artery pedal pulses are 2/4 bilateral with immedate capillary fill time. There is no pain with calf compression, swelling, warmth, erythema.   Neruologic: Sensation decreased with Thornell Mule  monofilament  Musculoskeletal: Prominence of metatarsal head plantarly atrophy of fat pad.  Muscular strength 5/5 in all groups tested bilateral.  Gait: Unassisted, Nonantalgic.       Assessment:   Preulcerative callus right foot-history of right foot surgery, infection     Plan:  -Treatment options discussed including all alternatives, risks, and complications -Etiology of symptoms were discussed -X-rays were obtained and reviewed with the patient.  No evidence of acute fracture.  Staples present from prior surgery.  No evidence of osteomyelitis -I separately billed the  hyperkeratotic lesion with a 312 with scalpel without any complications or bleeding.  Recommend moisturizer daily.  I will have him follow-up with Liliane Channel for insert to help take pressure off the foot as well to help prevent any reoccurrence.  Discussed the importance of the foot inspection.  Return in about 3 months (around 02/12/2020).  Trula Slade DPM

## 2019-11-15 LAB — HM DIABETES EYE EXAM

## 2019-11-16 ENCOUNTER — Encounter: Payer: Self-pay | Admitting: Dietician

## 2019-11-16 ENCOUNTER — Telehealth: Payer: Self-pay | Admitting: Dietician

## 2019-11-16 NOTE — Telephone Encounter (Signed)
Unable to reach patient by phone about setting up an appointment for diabetes training. Left voicemail for return call and will mail a letter.

## 2019-11-17 ENCOUNTER — Other Ambulatory Visit: Payer: Self-pay

## 2019-11-17 ENCOUNTER — Other Ambulatory Visit: Payer: Self-pay | Admitting: Internal Medicine

## 2019-11-17 ENCOUNTER — Encounter (INDEPENDENT_AMBULATORY_CARE_PROVIDER_SITE_OTHER): Payer: Managed Care, Other (non HMO) | Admitting: Ophthalmology

## 2019-11-17 DIAGNOSIS — E113313 Type 2 diabetes mellitus with moderate nonproliferative diabetic retinopathy with macular edema, bilateral: Secondary | ICD-10-CM | POA: Diagnosis not present

## 2019-11-17 DIAGNOSIS — H35033 Hypertensive retinopathy, bilateral: Secondary | ICD-10-CM

## 2019-11-17 DIAGNOSIS — E11311 Type 2 diabetes mellitus with unspecified diabetic retinopathy with macular edema: Secondary | ICD-10-CM

## 2019-11-17 DIAGNOSIS — E1142 Type 2 diabetes mellitus with diabetic polyneuropathy: Secondary | ICD-10-CM

## 2019-11-17 DIAGNOSIS — I10 Essential (primary) hypertension: Secondary | ICD-10-CM

## 2019-11-17 DIAGNOSIS — H43813 Vitreous degeneration, bilateral: Secondary | ICD-10-CM

## 2019-11-17 MED ORDER — GABAPENTIN 300 MG PO CAPS: 300.0000 mg | ORAL_CAPSULE | Freq: Three times a day (TID) | ORAL | 5 refills | Status: DC

## 2019-11-17 MED ORDER — LISINOPRIL-HYDROCHLOROTHIAZIDE 20-25 MG PO TABS: 1.0000 | ORAL_TABLET | Freq: Every day | ORAL | 2 refills | Status: DC

## 2019-11-17 NOTE — Telephone Encounter (Signed)
Need refill on gabapentin (NEURONTIN) 300 MG capsule lisinopril-hydrochlorothiazide (ZESTORETIC) 20-25 MG tablet  ;pt contact [phone]   [facility] [address] [place]

## 2019-11-22 ENCOUNTER — Other Ambulatory Visit: Payer: Managed Care, Other (non HMO) | Admitting: Orthotics

## 2019-11-24 ENCOUNTER — Encounter (INDEPENDENT_AMBULATORY_CARE_PROVIDER_SITE_OTHER): Payer: Managed Care, Other (non HMO) | Admitting: Ophthalmology

## 2019-11-24 ENCOUNTER — Other Ambulatory Visit: Payer: Self-pay

## 2019-11-24 DIAGNOSIS — E11311 Type 2 diabetes mellitus with unspecified diabetic retinopathy with macular edema: Secondary | ICD-10-CM

## 2019-11-24 DIAGNOSIS — E113313 Type 2 diabetes mellitus with moderate nonproliferative diabetic retinopathy with macular edema, bilateral: Secondary | ICD-10-CM

## 2019-11-28 ENCOUNTER — Ambulatory Visit (INDEPENDENT_AMBULATORY_CARE_PROVIDER_SITE_OTHER): Payer: Managed Care, Other (non HMO) | Admitting: Orthotics

## 2019-11-28 ENCOUNTER — Other Ambulatory Visit: Payer: Self-pay

## 2019-11-28 DIAGNOSIS — M216X1 Other acquired deformities of right foot: Secondary | ICD-10-CM | POA: Diagnosis not present

## 2019-11-28 DIAGNOSIS — E1149 Type 2 diabetes mellitus with other diabetic neurological complication: Secondary | ICD-10-CM | POA: Diagnosis not present

## 2019-11-28 DIAGNOSIS — M216X2 Other acquired deformities of left foot: Secondary | ICD-10-CM

## 2019-11-28 DIAGNOSIS — L84 Corns and callosities: Secondary | ICD-10-CM

## 2019-11-28 DIAGNOSIS — E1142 Type 2 diabetes mellitus with diabetic polyneuropathy: Secondary | ICD-10-CM

## 2019-11-28 NOTE — Progress Notes (Signed)
Patient cast for CMFO to address plantarflexed met head 2 R and previous ulceration.

## 2019-11-29 ENCOUNTER — Encounter: Payer: Self-pay | Admitting: *Deleted

## 2019-12-13 ENCOUNTER — Other Ambulatory Visit: Payer: Self-pay | Admitting: Dietician

## 2019-12-13 NOTE — Telephone Encounter (Signed)
Cora, patient's mother, calls for patient for a refill on the Xultophy. she confirms he is taking 40 units and to send it to [facility] at [address].

## 2019-12-14 MED ORDER — INSULIN DEGLUDEC-LIRAGLUTIDE 100-3.6 UNIT-MG/ML ~~LOC~~ SOPN: 40.0000 [IU] | PEN_INJECTOR | Freq: Every day | SUBCUTANEOUS | 3 refills | Status: DC

## 2019-12-14 MED ORDER — INSULIN DEGLUDEC-LIRAGLUTIDE 100-3.6 UNIT-MG/ML ~~LOC~~ SOPN: 40.0000 [IU] | PEN_INJECTOR | Freq: Every day | SUBCUTANEOUS | 1 refills | Status: AC

## 2019-12-14 NOTE — Addendum Note (Signed)
Addended by: Lenward Chancellor D on: 12/14/2019 02:37 PM   Modules accepted: Orders

## 2019-12-14 NOTE — Telephone Encounter (Signed)
Received call from [facility] at [place] wanting to know if Rx qty should be 115 mL or 15 mL as it is usually written. Please resend Rx if appropriate. Kinnie Feil, BSN, RN-BC

## 2019-12-14 NOTE — Telephone Encounter (Signed)
Medication corrected to 15 mL.

## 2019-12-18 NOTE — Addendum Note (Signed)
Addended by: Neomia Dear on: 12/18/2019 04:18 PM   Modules accepted: Orders

## 2019-12-19 ENCOUNTER — Telehealth: Payer: Self-pay | Admitting: Dietician

## 2019-12-19 ENCOUNTER — Telehealth: Payer: Self-pay | Admitting: *Deleted

## 2019-12-19 NOTE — Telephone Encounter (Signed)
Patient's mother calls saying her son went by the [facility] pharmacy and they did not have his Xultophy prescription. I informed her it was sent and receipt confirmed by pharmacy on 12-14-19.  She calls back after calling the pharmacy they are waiting for a prior authorization(PA) from [place]. Will route this to the nurse who does PAs.

## 2019-12-20 NOTE — Telephone Encounter (Signed)
Patient's mother notified.

## 2019-12-20 NOTE — Telephone Encounter (Signed)
Information was sent through CoverMyMeds for PA for Xultophy.  Awaiting determination.  Angelina Ok, RN 12/19/2019 2:47 PM PA for Xultophy was approved 12/19/2019 through 12/18/2020.  Angelina Ok, RN 12/20/2019 9:16 AM.

## 2019-12-20 NOTE — Telephone Encounter (Signed)
Approved 12/19/2019 thru 12/18/2020.

## 2019-12-21 ENCOUNTER — Other Ambulatory Visit: Payer: Self-pay

## 2019-12-21 ENCOUNTER — Ambulatory Visit: Payer: Managed Care, Other (non HMO) | Admitting: Orthotics

## 2019-12-21 DIAGNOSIS — M216X1 Other acquired deformities of right foot: Secondary | ICD-10-CM

## 2019-12-21 NOTE — Progress Notes (Signed)
Patient came in today to p/up functional foot orthotics.   The orthotics were assessed to both fit and function.  The F/O addressed the biomechanical issues/pathologies as intended, offering good longitudinal arch support, proper offloading, and foot support. There weren't any signs of discomfort or irritation.  The F/O fit properly in footwear with minimal trimming/adjustments. 

## 2019-12-29 ENCOUNTER — Other Ambulatory Visit: Payer: Self-pay

## 2019-12-29 ENCOUNTER — Encounter (INDEPENDENT_AMBULATORY_CARE_PROVIDER_SITE_OTHER): Payer: Managed Care, Other (non HMO) | Admitting: Ophthalmology

## 2019-12-29 DIAGNOSIS — H35033 Hypertensive retinopathy, bilateral: Secondary | ICD-10-CM

## 2019-12-29 DIAGNOSIS — E113313 Type 2 diabetes mellitus with moderate nonproliferative diabetic retinopathy with macular edema, bilateral: Secondary | ICD-10-CM | POA: Diagnosis not present

## 2019-12-29 DIAGNOSIS — H43813 Vitreous degeneration, bilateral: Secondary | ICD-10-CM

## 2019-12-29 DIAGNOSIS — E11311 Type 2 diabetes mellitus with unspecified diabetic retinopathy with macular edema: Secondary | ICD-10-CM

## 2019-12-29 DIAGNOSIS — I10 Essential (primary) hypertension: Secondary | ICD-10-CM

## 2020-01-03 ENCOUNTER — Telehealth: Payer: Self-pay | Admitting: Internal Medicine

## 2020-01-03 ENCOUNTER — Other Ambulatory Visit: Payer: Self-pay

## 2020-01-03 MED ORDER — LISINOPRIL-HYDROCHLOROTHIAZIDE 20-25 MG PO TABS
1.0000 | ORAL_TABLET | Freq: Every day | ORAL | 1 refills | Status: DC
Start: 2020-01-03 — End: 2020-07-30

## 2020-01-03 NOTE — Telephone Encounter (Signed)
Need refill on lisinopril-hydrochlorothiazide (ZESTORETIC) 20-25 MG tablet ;pt contact [phone]  Pt needs 90-day supply it is cheaper for insurance    [id number] - [place], [place] - [address] AT [facility] OF [address]

## 2020-01-03 NOTE — Telephone Encounter (Signed)
Patient did not show up for lab work, discontinued lab orders.

## 2020-01-03 NOTE — Telephone Encounter (Signed)
I will refill his medication. Last BP was good but HTN was not addressed at that visit. He needs a BMP. Please schedule for an appointment within the next week or so

## 2020-01-05 NOTE — Telephone Encounter (Signed)
This patient has sch an appt with his PCP on 03/07/2019.

## 2020-01-29 ENCOUNTER — Other Ambulatory Visit: Payer: Self-pay

## 2020-01-29 ENCOUNTER — Encounter (INDEPENDENT_AMBULATORY_CARE_PROVIDER_SITE_OTHER): Payer: Managed Care, Other (non HMO) | Admitting: Ophthalmology

## 2020-01-29 DIAGNOSIS — I10 Essential (primary) hypertension: Secondary | ICD-10-CM | POA: Diagnosis not present

## 2020-01-29 DIAGNOSIS — E11311 Type 2 diabetes mellitus with unspecified diabetic retinopathy with macular edema: Secondary | ICD-10-CM | POA: Diagnosis not present

## 2020-01-29 DIAGNOSIS — H35033 Hypertensive retinopathy, bilateral: Secondary | ICD-10-CM

## 2020-01-29 DIAGNOSIS — H43813 Vitreous degeneration, bilateral: Secondary | ICD-10-CM

## 2020-01-29 DIAGNOSIS — E113313 Type 2 diabetes mellitus with moderate nonproliferative diabetic retinopathy with macular edema, bilateral: Secondary | ICD-10-CM

## 2020-02-12 ENCOUNTER — Other Ambulatory Visit: Payer: Self-pay

## 2020-02-12 ENCOUNTER — Ambulatory Visit (INDEPENDENT_AMBULATORY_CARE_PROVIDER_SITE_OTHER): Payer: Managed Care, Other (non HMO) | Admitting: Podiatry

## 2020-02-12 DIAGNOSIS — E1142 Type 2 diabetes mellitus with diabetic polyneuropathy: Secondary | ICD-10-CM | POA: Diagnosis not present

## 2020-02-12 DIAGNOSIS — M216X1 Other acquired deformities of right foot: Secondary | ICD-10-CM | POA: Diagnosis not present

## 2020-02-12 DIAGNOSIS — L84 Corns and callosities: Secondary | ICD-10-CM | POA: Diagnosis not present

## 2020-02-12 DIAGNOSIS — E1149 Type 2 diabetes mellitus with other diabetic neurological complication: Secondary | ICD-10-CM

## 2020-02-12 NOTE — Patient Instructions (Signed)
Diabetes Mellitus and Foot Care Foot care is an important part of your health, especially when you have diabetes. Diabetes may cause you to have problems because of poor blood flow (circulation) to your feet and legs, which can cause your skin to:  Become thinner and drier.  Break more easily.  Heal more slowly.  Peel and crack. You may also have nerve damage (neuropathy) in your legs and feet, causing decreased feeling in them. This means that you may not notice minor injuries to your feet that could lead to more serious problems. Noticing and addressing any potential problems early is the best way to prevent future foot problems. How to care for your feet Foot hygiene  Wash your feet daily with warm water and mild soap. Do not use hot water. Then, pat your feet and the areas between your toes until they are completely dry. Do not soak your feet as this can dry your skin.  Trim your toenails straight across. Do not dig under them or around the cuticle. File the edges of your nails with an emery board or nail file.  Apply a moisturizing lotion or petroleum jelly to the skin on your feet and to dry, brittle toenails. Use lotion that does not contain alcohol and is unscented. Do not apply lotion between your toes. Shoes and socks  Wear clean socks or stockings every day. Make sure they are not too tight. Do not wear knee-high stockings since they may decrease blood flow to your legs.  Wear shoes that fit properly and have enough cushioning. Always look in your shoes before you put them on to be sure there are no objects inside.  To break in new shoes, wear them for just a few hours a day. This prevents injuries on your feet. Wounds, scrapes, corns, and calluses  Check your feet daily for blisters, cuts, bruises, sores, and redness. If you cannot see the bottom of your feet, use a mirror or ask someone for help.  Do not cut corns or calluses or try to remove them with medicine.  If you  find a minor scrape, cut, or break in the skin on your feet, keep it and the skin around it clean and dry. You may clean these areas with mild soap and water. Do not clean the area with peroxide, alcohol, or iodine.  If you have a wound, scrape, corn, or callus on your foot, look at it several times a day to make sure it is healing and not infected. Check for: ? Redness, swelling, or pain. ? Fluid or blood. ? Warmth. ? Pus or a bad smell. General instructions  Do not cross your legs. This may decrease blood flow to your feet.  Do not use heating pads or hot water bottles on your feet. They may burn your skin. If you have lost feeling in your feet or legs, you may not know this is happening until it is too late.  Protect your feet from hot and cold by wearing shoes, such as at the beach or on hot pavement.  Schedule a complete foot exam at least once a year (annually) or more often if you have foot problems. If you have foot problems, report any cuts, sores, or bruises to your health care provider immediately. Contact a health care provider if:  You have a medical condition that increases your risk of infection and you have any cuts, sores, or bruises on your feet.  You have an injury that is not   healing.  You have redness on your legs or feet.  You feel burning or tingling in your legs or feet.  You have pain or cramps in your legs and feet.  Your legs or feet are numb.  Your feet always feel cold.  You have pain around a toenail. Get help right away if:  You have a wound, scrape, corn, or callus on your foot and: ? You have pain, swelling, or redness that gets worse. ? You have fluid or blood coming from the wound, scrape, corn, or callus. ? Your wound, scrape, corn, or callus feels warm to the touch. ? You have pus or a bad smell coming from the wound, scrape, corn, or callus. ? You have a fever. ? You have a red line going up your leg. Summary  Check your feet every day  for cuts, sores, red spots, swelling, and blisters.  Moisturize feet and legs daily.  Wear shoes that fit properly and have enough cushioning.  If you have foot problems, report any cuts, sores, or bruises to your health care provider immediately.  Schedule a complete foot exam at least once a year (annually) or more often if you have foot problems. This information is not intended to replace advice given to you by your health care provider. Make sure you discuss any questions you have with your health care provider. Document Revised: 11/09/2018 Document Reviewed: 03/20/2016 Elsevier Patient Education  2020 [employer]  

## 2020-02-18 NOTE — Progress Notes (Signed)
Subjective: 49 year old male presents the office today for follow-up evaluation for preulcerative callus of his right foot from reopening previously.  He states that the calluses been minimal and the orthotics have been helpful for him he is not having any issues.  Denies any swelling or redness or any drainage and denies any open lesions. Denies any systemic complaints such as fevers, chills, nausea, vomiting. No acute changes since last appointment, and no other complaints at this time.   Objective: AAO x3, NAD DP/PT pulses palpable bilaterally, CRT less than 3 seconds On the right foot submetatarsal 2 with a hyperkeratotic lesion although minimal.  There is no underlying ulceration drainage or any signs of infection level today.  There is no edema, erythema.  No ulcerations bilaterally.  Prominence of metatarsal heads plantarly without of the fat pad. No pain with calf compression, swelling, warmth, erythema  Assessment: Preulcerative callus right foot, doing well  Plan: -All treatment options discussed with the patient including all alternatives, risks, complications.  -Debrided the minimal hyperkeratotic tissue on the right foot without any complications or bleeding.  Recommend moisturizer daily.  Orthotics appear to be fitting well wearing continue with this. -Daily foot inspections discussed -Patient encouraged to call the office with any questions, concerns, change in symptoms.   Return in about 6 months (around 08/12/2020).  Vivi Barrack DPM

## 2020-03-06 ENCOUNTER — Encounter: Payer: Self-pay | Admitting: Internal Medicine

## 2020-03-06 ENCOUNTER — Ambulatory Visit: Payer: 59 | Admitting: Internal Medicine

## 2020-03-06 ENCOUNTER — Other Ambulatory Visit: Payer: Self-pay

## 2020-03-06 VITALS — BP 149/98 | HR 95 | Temp 98.7°F | Ht 72.0 in | Wt 272.9 lb

## 2020-03-06 DIAGNOSIS — I10 Essential (primary) hypertension: Secondary | ICD-10-CM

## 2020-03-06 DIAGNOSIS — Z0000 Encounter for general adult medical examination without abnormal findings: Secondary | ICD-10-CM

## 2020-03-06 DIAGNOSIS — E1143 Type 2 diabetes mellitus with diabetic autonomic (poly)neuropathy: Secondary | ICD-10-CM | POA: Diagnosis not present

## 2020-03-06 DIAGNOSIS — N529 Male erectile dysfunction, unspecified: Secondary | ICD-10-CM | POA: Diagnosis not present

## 2020-03-06 DIAGNOSIS — L97519 Non-pressure chronic ulcer of other part of right foot with unspecified severity: Secondary | ICD-10-CM | POA: Diagnosis not present

## 2020-03-06 DIAGNOSIS — E11621 Type 2 diabetes mellitus with foot ulcer: Secondary | ICD-10-CM

## 2020-03-06 DIAGNOSIS — Z794 Long term (current) use of insulin: Secondary | ICD-10-CM

## 2020-03-06 DIAGNOSIS — E1140 Type 2 diabetes mellitus with diabetic neuropathy, unspecified: Secondary | ICD-10-CM

## 2020-03-06 LAB — GLUCOSE, CAPILLARY: Glucose-Capillary: 107 mg/dL — ABNORMAL HIGH (ref 70–99)

## 2020-03-06 LAB — POCT GLYCOSYLATED HEMOGLOBIN (HGB A1C): Hemoglobin A1C: 7.4 % — AB (ref 4.0–5.6)

## 2020-03-06 NOTE — Patient Instructions (Addendum)
Jason Monroe, It was great seeing you again!   Today we discussed:   1. Diabetes: your A1C is up a little to 7.4. This is understandable with all the holiday eating. I think you will be back down to your usual 6 range by next visit.   2. Blood pressure: It's up at today's visit, but it sounds like you're quite busy with work and moving. Just keep an eye on it and let me know if it is consistently above 130/80. If so, we'll make an adjustment to your medication.   3. ED: I have ordered the labs as we discussed. If these are normal, we can talk about taking a medication as needed to help with your symptoms. Be sure to come between 8:30 and 10 to get an accurate testosterone level   Take care and good luck with the move!  Dr. Chesley Mires

## 2020-03-06 NOTE — Progress Notes (Signed)
Established Patient Office Visit  Subjective:  Patient ID: Jason Monroe, male    DOB: [birth year]  Age: 50 y.o. MRN: [id number]  CC: follow-up on DM   HPI Jason Monroe presents for follow-up on chronic type II DM, HTN, and ED. Please see problem based charting for details on today's visit.   Past Medical History:  Diagnosis Date  . Diabetes mellitus without complication ([place])   . Diabetic ulcer of foot with fat layer exposed ([place]) 10/23/2017    Past Surgical History:  Procedure Laterality Date  . I & D EXTREMITY Right 10/24/2017   Procedure: IRRIGATION AND DEBRIDEMENT OF FOOT;  Surgeon: Newt Minion, MD;  Location: [place];  Service: Orthopedics;  Laterality: Right;  . I & D EXTREMITY Right 10/27/2017   Procedure: DEBRIDEMENT AND SKIN GRAFT RIGHT FOOT;  Surgeon: Newt Minion, MD;  Location: [place];  Service: Orthopedics;  Laterality: Right;    Family History  Problem Relation Age of Onset  . Arrhythmia Sister   . Diabetes type I Maternal Grandmother     Social History   Socioeconomic History  . Marital status: Single    Spouse name: Not on file  . Number of children: Not on file  . Years of education: Not on file  . Highest education level: Not on file  Occupational History  . Not on file  Tobacco Use  . Smoking status: Former Smoker    Types: Pipe  . Smokeless tobacco: Never Used  . Tobacco comment: quit 58yr ago  Substance and Sexual Activity  . Alcohol use: Not Currently  . Drug use: No  . Sexual activity: Not on file  Other Topics Concern  . Not on file  Social History Narrative  . Not on file   Social Determinants of Health   Financial Resource Strain: Not on file  Food Insecurity: Not on file  Transportation Needs: Not on file  Physical Activity: Not on file  Stress: Not on file  Social Connections: Not on file  Intimate Partner Violence: Not on file    Outpatient Medications Prior to Visit  Medication Sig Dispense Refill  . blood glucose  meter kit and supplies KIT Dispense based on patient and insurance preference. Use up to four times daily as directed. (FOR ICD-9 250.00, 250.01). 1 each 0  . Continuous Blood Gluc Receiver (FREESTYLE LIBRE 14 DAY READER) DEVI Check blood sugar 6 times a day 1 each 0  . Continuous Blood Gluc Sensor (FREESTYLE LIBRE 14 DAY SENSOR) MISC Check blood sugar 6 times a day 2 each 11  . gabapentin (NEURONTIN) 300 MG capsule Take 1 capsule (300 mg total) by mouth 3 (three) times daily. 90 capsule 5  . ibuprofen (ADVIL,MOTRIN) 200 MG tablet Take 600 mg by mouth every 12 (twelve) hours.     . Insulin Pen Needle (PEN NEEDLES) 31G X 5 MM MISC 1 Units by Does not apply route 2 (two) times daily. 100 each 0  . Insulin Syringes, Disposable, U-100 0.3 ML MISC 13 units of novolin at breakfast and dinner 100 each 0  . lisinopril-hydrochlorothiazide (ZESTORETIC) 20-25 MG tablet Take 1 tablet by mouth daily. 90 tablet 1  . silver sulfADIAZINE (SILVADENE) 1 % cream Apply 1 application topically daily. Apply to right foot daily after gentle cleaning 400 g 0   No facility-administered medications prior to visit.    Allergies  Allergen Reactions  . Metformin And Related Hives    ROS Review of Systems  Constitutional: Negative for activity change, fatigue and unexpected weight change.  HENT: Negative for dental problem, hearing loss and sinus pressure.   Eyes: Negative for visual disturbance.  Respiratory: Negative for cough and shortness of breath.   Cardiovascular: Negative for chest pain, palpitations and leg swelling.  Gastrointestinal: Negative for abdominal pain, nausea and vomiting.  Endocrine: Negative for polydipsia and polyuria.  Genitourinary: Negative for difficulty urinating.  Musculoskeletal: Negative for gait problem and joint swelling.  Skin: Negative for wound.  Neurological: Negative for dizziness, light-headedness and headaches.  Psychiatric/Behavioral: Negative for dysphoric mood and sleep  disturbance.      Objective:    Physical Exam Constitutional:      General: He is not in acute distress.    Appearance: Normal appearance.  Eyes:     Conjunctiva/sclera: Conjunctivae normal.  Cardiovascular:     Rate and Rhythm: Normal rate and regular rhythm.     Pulses: Normal pulses.  Pulmonary:     Effort: Pulmonary effort is normal.     Breath sounds: Normal breath sounds.  Abdominal:     General: Bowel sounds are normal. There is no distension.     Palpations: Abdomen is soft.     Tenderness: There is no abdominal tenderness.  Musculoskeletal:     Cervical back: Neck supple.     Right lower leg: No edema.     Left lower leg: No edema.  Skin:    General: Skin is warm and dry.  Neurological:     General: No focal deficit present.     Mental Status: He is alert.  Psychiatric:        Mood and Affect: Mood normal.        Behavior: Behavior normal.     BP (!) 149/98 (BP Location: Left Arm, Patient Position: Sitting, Cuff Size: Normal)   Pulse 95   Temp 98.7 F (37.1 C) (Oral)   Ht 6' (1.829 m)   Wt 272 lb 14.4 oz (123.8 kg)   SpO2 98% Comment: room air  BMI 37.01 kg/m  Wt Readings from Last 3 Encounters:  03/06/20 272 lb 14.4 oz (123.8 kg)  10/09/19 270 lb 11.2 oz (122.8 kg)  07/26/19 271 lb 1.6 oz (123 kg)     Health Maintenance Due  Topic Date Due  . Hepatitis C Screening  Never done  . PNEUMOCOCCAL POLYSACCHARIDE VACCINE AGE 7-64 HIGH RISK  Never done  . TETANUS/TDAP  Never done  . COLONOSCOPY (Pts 45-20yr Insurance coverage will need to be confirmed)  Never done  . INFLUENZA VACCINE  10/01/2019    There are no preventive care reminders to display for this patient.  No results found for: TSH Lab Results  Component Value Date   WBC 13.9 (H) 10/28/2017   HGB 11.3 (L) 10/28/2017   HCT 34.2 (L) 10/28/2017   MCV 90.2 10/28/2017   PLT 404 (H) 10/28/2017   Lab Results  Component Value Date   NA 140 04/03/2019   K 4.6 04/03/2019   CO2 25  04/03/2019   GLUCOSE 83 04/03/2019   BUN 12 04/03/2019   CREATININE 0.92 04/03/2019   BILITOT 1.4 (H) 10/23/2017   ALKPHOS 101 10/23/2017   AST 22 10/23/2017   ALT 22 10/23/2017   PROT 7.6 10/23/2017   ALBUMIN 2.9 (L) 10/23/2017   CALCIUM 9.3 04/03/2019   ANIONGAP 8 10/28/2017   Lab Results  Component Value Date   CHOL 109 04/03/2019   Lab Results  Component Value Date  HDL 43 04/03/2019   Lab Results  Component Value Date   LDLCALC 53 04/03/2019   Lab Results  Component Value Date   TRIG 60 04/03/2019   Lab Results  Component Value Date   CHOLHDL 2.5 04/03/2019   Lab Results  Component Value Date   HGBA1C 7.4 (A) 03/06/2020      Assessment & Plan:   Problem List Items Addressed This Visit      Cardiovascular and Mediastinum   Essential hypertension    Patient's blood pressure is elevated at today's visit. He tells me he has a lot going on with work and is in the process of moving into a new house. His blood pressure readings at other recent visits have been at goal. He will monitor his readings with his home cuff and was instructed to bring his log and home cuff at his follow-up visit to compare readings.  Continue same dose of Lisinopril-HCTZ for now.         Endocrine   Diabetes mellitus with diabetic neuropathy, with long-term current use of insulin ([place]) - Primary    Patient's A1C is up a bit today to 7.4. He attributes this to indulging in holiday eating more than he should have, but is quite motivated to get back on track with his eating.  Will continue current dose of xultophy which he gets through the cover my meds program. He is also interested in working with Butch Penny to try to get libre continuous glucose monitoring.   He is followed by podiatry to stay on top of his foot care. Everything looked good at his most recent visit last month.   Also followed by ophthalmology for diabetes related macular degeneration. His next injection is this Friday, and  he was told they are considering laser surgery in the next few months.          Other   Healthcare maintenance    Patient will get influenza and tetanus booster when he comes in for labs next week.   He is fully vaccinated for COVID.       Erectile dysfunction    Patient brought this issue up at his previous visit with a different provider. A sufficient work-up had been ordered, but unfortunately he was unable to return for lab visit due to work hours. He assures me he can get this done next Friday morning. I have ordered CMP, TSH and total testosterone to be collected at this time.  If unremarkable, he will need GU exam at next visit prior to initiating therapy for ED.      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   Follow-up: Return in about 9 days (around 03/15/2020) for labs, flu and tetanus shot .    Delice Bison, DO

## 2020-03-07 ENCOUNTER — Encounter: Payer: Self-pay | Admitting: Internal Medicine

## 2020-03-07 NOTE — Assessment & Plan Note (Signed)
Patient brought this issue up at his previous visit with a different provider. A sufficient work-up had been ordered, but unfortunately he was unable to return for lab visit due to work hours. He assures me he can get this done next Friday morning. I have ordered CMP, TSH and total testosterone to be collected at this time.  If unremarkable, he will need GU exam at next visit prior to initiating therapy for ED.

## 2020-03-07 NOTE — Assessment & Plan Note (Signed)
Patient's A1C is up a bit today to 7.4. He attributes this to indulging in holiday eating more than he should have, but is quite motivated to get back on track with his eating.  Will continue current dose of xultophy which he gets through the cover my meds program. He is also interested in working with [name] to try to get libre continuous glucose monitoring.   He is followed by podiatry to stay on top of his foot care. Everything looked good at his most recent visit last month.   Also followed by ophthalmology for diabetes related macular degeneration. His next injection is this Friday, and he was told they are considering laser surgery in the next few months.

## 2020-03-07 NOTE — Assessment & Plan Note (Addendum)
Patient will get influenza and tetanus booster when he comes in for labs next week.   He is fully vaccinated for COVID.

## 2020-03-07 NOTE — Assessment & Plan Note (Signed)
Patient's blood pressure is elevated at today's visit. He tells me he has a lot going on with work and is in the process of moving into a new house. His blood pressure readings at other recent visits have been at goal. He will monitor his readings with his home cuff and was instructed to bring his log and home cuff at his follow-up visit to compare readings.  Continue same dose of Lisinopril-HCTZ for now.

## 2020-03-08 ENCOUNTER — Encounter (INDEPENDENT_AMBULATORY_CARE_PROVIDER_SITE_OTHER): Payer: 59 | Admitting: Ophthalmology

## 2020-03-08 ENCOUNTER — Other Ambulatory Visit: Payer: Self-pay

## 2020-03-08 DIAGNOSIS — E113313 Type 2 diabetes mellitus with moderate nonproliferative diabetic retinopathy with macular edema, bilateral: Secondary | ICD-10-CM

## 2020-03-08 DIAGNOSIS — H35033 Hypertensive retinopathy, bilateral: Secondary | ICD-10-CM

## 2020-03-08 DIAGNOSIS — H43813 Vitreous degeneration, bilateral: Secondary | ICD-10-CM | POA: Diagnosis not present

## 2020-03-08 DIAGNOSIS — I10 Essential (primary) hypertension: Secondary | ICD-10-CM | POA: Diagnosis not present

## 2020-03-08 NOTE — Progress Notes (Signed)
Internal Medicine Clinic Attending  Case discussed with Dr. Bloomfield  At the time of the visit.  We reviewed the resident's history and exam and pertinent patient test results.  I agree with the assessment, diagnosis, and plan of care documented in the resident's note.  

## 2020-03-21 ENCOUNTER — Encounter (INDEPENDENT_AMBULATORY_CARE_PROVIDER_SITE_OTHER): Payer: Managed Care, Other (non HMO) | Admitting: Ophthalmology

## 2020-03-22 ENCOUNTER — Encounter (INDEPENDENT_AMBULATORY_CARE_PROVIDER_SITE_OTHER): Payer: Managed Care, Other (non HMO) | Admitting: Ophthalmology

## 2020-03-22 ENCOUNTER — Encounter (INDEPENDENT_AMBULATORY_CARE_PROVIDER_SITE_OTHER): Payer: 59 | Admitting: Ophthalmology

## 2020-03-22 ENCOUNTER — Other Ambulatory Visit: Payer: Self-pay

## 2020-03-22 DIAGNOSIS — E113311 Type 2 diabetes mellitus with moderate nonproliferative diabetic retinopathy with macular edema, right eye: Secondary | ICD-10-CM

## 2020-04-05 ENCOUNTER — Other Ambulatory Visit: Payer: Self-pay

## 2020-04-05 ENCOUNTER — Encounter (INDEPENDENT_AMBULATORY_CARE_PROVIDER_SITE_OTHER): Payer: 59 | Admitting: Ophthalmology

## 2020-04-05 DIAGNOSIS — E113313 Type 2 diabetes mellitus with moderate nonproliferative diabetic retinopathy with macular edema, bilateral: Secondary | ICD-10-CM

## 2020-04-05 DIAGNOSIS — H35033 Hypertensive retinopathy, bilateral: Secondary | ICD-10-CM | POA: Diagnosis not present

## 2020-04-05 DIAGNOSIS — I10 Essential (primary) hypertension: Secondary | ICD-10-CM | POA: Diagnosis not present

## 2020-04-05 DIAGNOSIS — H43813 Vitreous degeneration, bilateral: Secondary | ICD-10-CM

## 2020-04-23 ENCOUNTER — Ambulatory Visit: Payer: 59 | Admitting: Podiatry

## 2020-04-23 ENCOUNTER — Other Ambulatory Visit: Payer: Self-pay | Admitting: Podiatry

## 2020-04-23 ENCOUNTER — Other Ambulatory Visit: Payer: Self-pay

## 2020-04-23 ENCOUNTER — Ambulatory Visit (INDEPENDENT_AMBULATORY_CARE_PROVIDER_SITE_OTHER): Payer: 59

## 2020-04-23 DIAGNOSIS — S90851A Superficial foreign body, right foot, initial encounter: Secondary | ICD-10-CM | POA: Diagnosis not present

## 2020-04-23 DIAGNOSIS — E1149 Type 2 diabetes mellitus with other diabetic neurological complication: Secondary | ICD-10-CM

## 2020-04-23 DIAGNOSIS — L97511 Non-pressure chronic ulcer of other part of right foot limited to breakdown of skin: Secondary | ICD-10-CM | POA: Diagnosis not present

## 2020-04-23 DIAGNOSIS — E1142 Type 2 diabetes mellitus with diabetic polyneuropathy: Secondary | ICD-10-CM | POA: Diagnosis not present

## 2020-04-23 DIAGNOSIS — S90111A Contusion of right great toe without damage to nail, initial encounter: Secondary | ICD-10-CM

## 2020-04-23 MED ORDER — DOXYCYCLINE HYCLATE 100 MG PO TABS: 100.0000 mg | ORAL_TABLET | Freq: Two times a day (BID) | ORAL | 0 refills | Status: DC

## 2020-04-28 NOTE — Progress Notes (Signed)
Subjective: 50 year old male presents the office today for concerns of a new preulcerative callus to the bottom of his right big toe.  After questioning this seems to have started after he stepped on a nail around the first of the year but not still causing issues about 3 weeks ago.  Denies any drainage or pus any swelling.  Said no recent treatment.  He states that when he stepped on the nail nothing broke off but he can recall. Denies any systemic complaints such as fevers, chills, nausea, vomiting. No acute changes since last appointment, and no other complaints at this time.   Objective: AAO x3, NAD DP/PT pulses palpable bilaterally, CRT less than 3 seconds On the plantar aspect of the right hallux is a hyperkeratotic lesion and what appears to be an old blister.  I was able to debride the hyperkeratotic tissue as well as the old blister.  After debridement the blister new healthy skin was intact however under the callus was a superficial granular wound identified.  There is some minimal edema to the hallux but there is no significant erythema or warmth.  There is no drainage or pus or ascending cellulitis.  No pain with calf compression, swelling, warmth, erythema  Assessment: 50 year old male hyperkeratotic lesion after stepping on nail with blister  Plan: -All treatment options discussed with the patient including all alternatives, risks, complications.  -X-ray today reviewed.  No evidence of foreign body identified there is no evidence of acute fracture, stress fracture or osteomyelitis. -Postoperatively the hyperkeratotic tissue to reveal superficial ulceration.  Debrided tissue down to healthy, granular tissue to promote wound healing to debride nonviable devitalized tissue.  Prescribe doxycycline.  Antibiotic ointment dressing changes daily.  Offloading. -States that his tetanus is up-to-date. -Monitor for any clinical signs or symptoms of infection and directed to call the office  immediately should any occur or go to the ER. -Patient encouraged to call the office with any questions, concerns, change in symptoms.   Vivi Barrack DPM

## 2020-05-03 ENCOUNTER — Other Ambulatory Visit: Payer: Self-pay

## 2020-05-03 ENCOUNTER — Encounter (INDEPENDENT_AMBULATORY_CARE_PROVIDER_SITE_OTHER): Payer: 59 | Admitting: Ophthalmology

## 2020-05-03 DIAGNOSIS — I10 Essential (primary) hypertension: Secondary | ICD-10-CM | POA: Diagnosis not present

## 2020-05-03 DIAGNOSIS — H43813 Vitreous degeneration, bilateral: Secondary | ICD-10-CM

## 2020-05-03 DIAGNOSIS — H35033 Hypertensive retinopathy, bilateral: Secondary | ICD-10-CM

## 2020-05-03 DIAGNOSIS — E113313 Type 2 diabetes mellitus with moderate nonproliferative diabetic retinopathy with macular edema, bilateral: Secondary | ICD-10-CM | POA: Diagnosis not present

## 2020-05-07 ENCOUNTER — Ambulatory Visit (INDEPENDENT_AMBULATORY_CARE_PROVIDER_SITE_OTHER): Payer: 59 | Admitting: Podiatry

## 2020-05-07 ENCOUNTER — Other Ambulatory Visit: Payer: Self-pay

## 2020-05-07 DIAGNOSIS — E1149 Type 2 diabetes mellitus with other diabetic neurological complication: Secondary | ICD-10-CM | POA: Diagnosis not present

## 2020-05-07 DIAGNOSIS — S90851D Superficial foreign body, right foot, subsequent encounter: Secondary | ICD-10-CM

## 2020-05-07 DIAGNOSIS — L97511 Non-pressure chronic ulcer of other part of right foot limited to breakdown of skin: Secondary | ICD-10-CM

## 2020-05-12 NOTE — Progress Notes (Signed)
Subjective: 50 year old male presents the office today for follow-up evaluation of blister, preulcerative area to the plantar aspect of the right hallux from stepping on a nail.  He states that he has been doing great and he has no issues.  No swelling no redness no open sores.  No blister formations reoccurred.  He has no open lesions he has no other concerns today. Denies any systemic complaints such as fevers, chills, nausea, vomiting. No acute changes since last appointment, and no other complaints at this time.   Objective: AAO x3, NAD DP/PT pulses palpable bilaterally, CRT less than 3 seconds On the plantar aspect of the hallux there is no blister formation is no hyperkeratotic tissue there is no ulcerations.  There is no edema, erythema or signs of infection.  No fluctuation crepitation.  There is no malodor.  No pain with calf compression, swelling, warmth, erythema  Assessment: 50 year old male much improvement status post stepping on nail  Plan: -All treatment options discussed with the patient including all alternatives, risks, complications.  -Discussed importance of daily foot inspection and wearing shoes.  If there is any issues to call the office immediately and not wait.  At this point the hallux is doing great and will continue to monitor for any issues.  I will follow-up with him as needed for diabetic foot exam. -Patient encouraged to call the office with any questions, concerns, change in symptoms.   Vivi Barrack DPM

## 2020-05-17 ENCOUNTER — Other Ambulatory Visit: Payer: Self-pay

## 2020-05-17 ENCOUNTER — Encounter (INDEPENDENT_AMBULATORY_CARE_PROVIDER_SITE_OTHER): Payer: 59 | Admitting: Ophthalmology

## 2020-05-17 DIAGNOSIS — E113312 Type 2 diabetes mellitus with moderate nonproliferative diabetic retinopathy with macular edema, left eye: Secondary | ICD-10-CM | POA: Diagnosis not present

## 2020-05-31 ENCOUNTER — Encounter (INDEPENDENT_AMBULATORY_CARE_PROVIDER_SITE_OTHER): Payer: 59 | Admitting: Ophthalmology

## 2020-05-31 ENCOUNTER — Other Ambulatory Visit: Payer: Self-pay

## 2020-05-31 DIAGNOSIS — I10 Essential (primary) hypertension: Secondary | ICD-10-CM | POA: Diagnosis not present

## 2020-05-31 DIAGNOSIS — E113313 Type 2 diabetes mellitus with moderate nonproliferative diabetic retinopathy with macular edema, bilateral: Secondary | ICD-10-CM

## 2020-05-31 DIAGNOSIS — H35033 Hypertensive retinopathy, bilateral: Secondary | ICD-10-CM

## 2020-05-31 DIAGNOSIS — H43813 Vitreous degeneration, bilateral: Secondary | ICD-10-CM | POA: Diagnosis not present

## 2020-06-21 ENCOUNTER — Other Ambulatory Visit: Payer: 59

## 2020-06-24 ENCOUNTER — Other Ambulatory Visit (INDEPENDENT_AMBULATORY_CARE_PROVIDER_SITE_OTHER): Payer: 59

## 2020-06-24 DIAGNOSIS — N529 Male erectile dysfunction, unspecified: Secondary | ICD-10-CM | POA: Diagnosis not present

## 2020-06-25 LAB — CMP14 + ANION GAP
ALT: 22 IU/L (ref 0–44)
AST: 21 IU/L (ref 0–40)
Albumin/Globulin Ratio: 1.9 (ref 1.2–2.2)
Albumin: 4.5 g/dL (ref 4.0–5.0)
Alkaline Phosphatase: 76 IU/L (ref 44–121)
Anion Gap: 16 mmol/L (ref 10.0–18.0)
BUN/Creatinine Ratio: 15 (ref 9–20)
BUN: 12 mg/dL (ref 6–24)
Bilirubin Total: 0.3 mg/dL (ref 0.0–1.2)
CO2: 22 mmol/L (ref 20–29)
Calcium: 9.3 mg/dL (ref 8.7–10.2)
Chloride: 101 mmol/L (ref 96–106)
Creatinine, Ser: 0.82 mg/dL (ref 0.76–1.27)
Globulin, Total: 2.4 g/dL (ref 1.5–4.5)
Glucose: 104 mg/dL — ABNORMAL HIGH (ref 65–99)
Potassium: 4.9 mmol/L (ref 3.5–5.2)
Sodium: 139 mmol/L (ref 134–144)
Total Protein: 6.9 g/dL (ref 6.0–8.5)
eGFR: 108 mL/min/{1.73_m2} (ref >59)

## 2020-06-25 LAB — TESTOSTERONE: Testosterone: 318 ng/dL (ref 264–916)

## 2020-06-25 LAB — TSH: TSH: 1.63 u[IU]/mL (ref 0.450–4.500)

## 2020-06-28 ENCOUNTER — Encounter (INDEPENDENT_AMBULATORY_CARE_PROVIDER_SITE_OTHER): Payer: 59 | Admitting: Ophthalmology

## 2020-06-28 ENCOUNTER — Other Ambulatory Visit: Payer: Self-pay

## 2020-06-28 DIAGNOSIS — H43813 Vitreous degeneration, bilateral: Secondary | ICD-10-CM

## 2020-06-28 DIAGNOSIS — I10 Essential (primary) hypertension: Secondary | ICD-10-CM | POA: Diagnosis not present

## 2020-06-28 DIAGNOSIS — H35033 Hypertensive retinopathy, bilateral: Secondary | ICD-10-CM

## 2020-06-28 DIAGNOSIS — H2513 Age-related nuclear cataract, bilateral: Secondary | ICD-10-CM

## 2020-06-28 DIAGNOSIS — E113313 Type 2 diabetes mellitus with moderate nonproliferative diabetic retinopathy with macular edema, bilateral: Secondary | ICD-10-CM | POA: Diagnosis not present

## 2020-07-27 ENCOUNTER — Other Ambulatory Visit: Payer: Self-pay | Admitting: Student

## 2020-08-02 ENCOUNTER — Other Ambulatory Visit: Payer: Self-pay

## 2020-08-02 ENCOUNTER — Encounter (INDEPENDENT_AMBULATORY_CARE_PROVIDER_SITE_OTHER): Payer: 59 | Admitting: Ophthalmology

## 2020-08-02 DIAGNOSIS — E113313 Type 2 diabetes mellitus with moderate nonproliferative diabetic retinopathy with macular edema, bilateral: Secondary | ICD-10-CM | POA: Diagnosis not present

## 2020-08-02 DIAGNOSIS — H2513 Age-related nuclear cataract, bilateral: Secondary | ICD-10-CM

## 2020-08-02 DIAGNOSIS — H35033 Hypertensive retinopathy, bilateral: Secondary | ICD-10-CM | POA: Diagnosis not present

## 2020-08-02 DIAGNOSIS — H43813 Vitreous degeneration, bilateral: Secondary | ICD-10-CM

## 2020-08-02 DIAGNOSIS — H35372 Puckering of macula, left eye: Secondary | ICD-10-CM | POA: Diagnosis not present

## 2020-08-02 DIAGNOSIS — I10 Essential (primary) hypertension: Secondary | ICD-10-CM | POA: Diagnosis not present

## 2020-08-02 NOTE — Telephone Encounter (Signed)
F/u appt sch for 08/05/2020 with Dr. Claudette Laws.

## 2020-08-04 NOTE — Progress Notes (Signed)
   CC: erectile dysfunction  HPI:  [name] is a 50 y.o. man with history as below who presents to clinic for follow-up of erectile dysfunction. His last clinic visit was on 03/06/20.   To see the details of this patient's management of their acute and chronic problems, please refer to the Assessment & Plan under the Encounters tab.    Past Medical History:  Diagnosis Date  . Diabetes mellitus without complication ([facility])   . Diabetic ulcer of foot with fat layer exposed ([facility]) 10/23/2017   Review of Systems:    Review of Systems  Constitutional: Negative for chills and fever.  Eyes: Negative for blurred vision.  Respiratory: Negative for cough and shortness of breath.   Cardiovascular: Negative for chest pain.  Gastrointestinal: Negative for abdominal pain, nausea and vomiting.  Genitourinary: Negative for dysuria.  Musculoskeletal: Negative for myalgias.  Neurological: Negative for dizziness, weakness and headaches.    Physical Exam:  Vitals:   08/05/20 1556 08/05/20 1559  BP: (!) 151/89 (!) 135/91  Pulse: 69 72  Temp: 97.8 F (36.6 C)   TempSrc: Oral   SpO2: 98%   Weight: 275 lb 14.4 oz (125.1 kg)   Height: 6' (1.829 m)    Constitutional: well-appearing man sitting in chair, in no acute distress HENT: normocephalic atraumatic, mucous membranes moist Eyes: conjunctiva non-erythematous Neck: supple Cardiovascular: regular rate and rhythm, no m/r/g Pulmonary/Chest: normal work of breathing on room air, lungs clear to auscultation bilaterally Abdominal: non-distended MSK: normal bulk and tone Neurological: alert & oriented x 3, normal gait Skin: warm and dry Psych: normal mood and affect GU: Uncircumcised male, prepuce easily retracts, no penile discharge or lesions, no ulcerations, scars, nodules, or signs of inflammation   Assessment & Plan:   See Encounters Tab for problem-based charting.  Patient discussed with Dr. Antony Contras

## 2020-08-04 NOTE — Patient Instructions (Addendum)
Mr. Mogg,   Thank you for your visit to the [facility] today. It was a pleasure meeting you. Today we discussed the following:  1) Diabetes - Your A1c decreased to 7%. Great work! - I placed an order for the [employer] to your pharmacy. Once you pick it up, please give [name] a call to schedule an appointment for a tutorial.  2) Erectile dysfunction - Based on your lab work and exam, most likely this is from your diabetes. - I have prescribed sildenafil (Viagra) 100 mg tablets. Start by taking 1/2 pill 30 minutes prior to intercourse. You can take a full pill if needed. As discussed, it can lower your blood pressure, so monitor for this side effect.  3) Hypertension - Your BP was a bit above goal of <130/80 today at 135/91. We will monitor for now but may need to increase your antihypertensive dose if it continues to be elevated.   You will be assigned a new primary care doctor. Please schedule 76-month follow-up with them when you receive your letter in the mail.    If you have any questions or concerns, please call our clinic at [phone] between 9am-5pm. Outside of these hours, call [phone] and ask for the internal medicine resident on call. If you feel you are having a medical emergency please call 911.

## 2020-08-05 ENCOUNTER — Ambulatory Visit (INDEPENDENT_AMBULATORY_CARE_PROVIDER_SITE_OTHER): Payer: 59 | Admitting: Student

## 2020-08-05 ENCOUNTER — Encounter: Payer: Self-pay | Admitting: Student

## 2020-08-05 ENCOUNTER — Other Ambulatory Visit: Payer: Self-pay

## 2020-08-05 VITALS — BP 135/91 | HR 72 | Temp 97.8°F | Ht 72.0 in | Wt 275.9 lb

## 2020-08-05 DIAGNOSIS — E1140 Type 2 diabetes mellitus with diabetic neuropathy, unspecified: Secondary | ICD-10-CM | POA: Diagnosis not present

## 2020-08-05 DIAGNOSIS — N529 Male erectile dysfunction, unspecified: Secondary | ICD-10-CM

## 2020-08-05 DIAGNOSIS — I10 Essential (primary) hypertension: Secondary | ICD-10-CM | POA: Diagnosis not present

## 2020-08-05 DIAGNOSIS — Z794 Long term (current) use of insulin: Secondary | ICD-10-CM

## 2020-08-05 LAB — POCT GLYCOSYLATED HEMOGLOBIN (HGB A1C): Hemoglobin A1C: 7 % — AB (ref 4.0–5.6)

## 2020-08-05 LAB — GLUCOSE, CAPILLARY: Glucose-Capillary: 109 mg/dL — ABNORMAL HIGH (ref 70–99)

## 2020-08-05 MED ORDER — SILDENAFIL CITRATE 100 MG PO TABS: 100.0000 mg | ORAL_TABLET | ORAL | 0 refills | Status: AC | PRN

## 2020-08-05 MED ORDER — FREESTYLE LIBRE 2 SENSOR MISC: 11 refills | Status: DC

## 2020-08-05 MED ORDER — FREESTYLE LIBRE 2 READER DEVI: 0 refills | Status: DC

## 2020-08-07 NOTE — Assessment & Plan Note (Signed)
Patient's A1c improved to 7.0% from 7.4% at last check on 03/07/20. He reports he has been reading extensively about diabetes diet. Notes that since he recently had several lower teeth pulled he is interested in meeting with [name] again to talk about dietary modifications. He reports adherence to his Xultophy 36 u once daily. He requests prescription for [name] since his job makes it difficult to conveniently monitor his blood sugar during the day. When he is able to check, his sugars range 115-120. Reports he recently had one symptomatic low and upon checking his glucose was 80, and his symptoms resolved with drinking juice.   A/P: - Freestyle Libre ordered, patient to schedule follow-up with [name] when he receives it - Repeat A1c in 3 months

## 2020-08-07 NOTE — Assessment & Plan Note (Signed)
Patient presents for follow-up of erectile dysfunction. Since his last visit on 03/07/20, he was able to get the lab work previously ordered. CMP, TSH, and testosterone (318) collected 06/24/20 were within normal limits. He reports persistent symptoms of ED as described previously - inability to obtain an erection, even morning erection. He reports he has been able to obtain a semi-erection at times but only with "significant work."   GU exam performed today unremarkable.   Plan: - start sildenafil, will prescribe 100 mg tablets, instructed patient to start with 1/2 tablet (50 mg) 30 mins prior to sexual activity, may take up to additional 1/2 tablet if needed - patient counseled on potential side effects

## 2020-08-07 NOTE — Assessment & Plan Note (Signed)
Patient's BP is elevated at 135/91 at today's visit. He reports adherence to his lisinopril-HCTZ 20-25 mg daily. His BP at his last visit was also elevated and at that time he was under stress related to moving into a new house. He was instructed to monitor his BP with his home cuff and bring the log to his next appointment. He states he did not record his BP. Given that we are initiating sildenafil today, I will not change his antihypertensive regimen, however this may be indicated at future visits.

## 2020-08-09 ENCOUNTER — Encounter: Payer: Self-pay | Admitting: *Deleted

## 2020-08-09 NOTE — Progress Notes (Signed)
Internal Medicine Clinic Attending  Case discussed with Dr. Watson  At the time of the visit.  We reviewed the resident's history and exam and pertinent patient test results.  I agree with the assessment, diagnosis, and plan of care documented in the resident's note.  

## 2020-08-15 ENCOUNTER — Other Ambulatory Visit: Payer: Self-pay

## 2020-08-15 ENCOUNTER — Ambulatory Visit (INDEPENDENT_AMBULATORY_CARE_PROVIDER_SITE_OTHER): Payer: 59 | Admitting: Podiatry

## 2020-08-15 DIAGNOSIS — B351 Tinea unguium: Secondary | ICD-10-CM

## 2020-08-15 DIAGNOSIS — E1149 Type 2 diabetes mellitus with other diabetic neurological complication: Secondary | ICD-10-CM

## 2020-08-15 DIAGNOSIS — M79675 Pain in left toe(s): Secondary | ICD-10-CM

## 2020-08-15 DIAGNOSIS — M79674 Pain in right toe(s): Secondary | ICD-10-CM

## 2020-08-15 DIAGNOSIS — E1142 Type 2 diabetes mellitus with diabetic polyneuropathy: Secondary | ICD-10-CM | POA: Diagnosis not present

## 2020-08-19 NOTE — Progress Notes (Signed)
Subjective: 50 year old male presents the office today for diabetic foot evaluation.  He states he is doing well with any new issues.  He actually feels that he is getting some feeling back to his foot.  Also asking for nails be trimmed as are thickened elongated he has difficulty trimming them.  He has no other concerns today.  His last A1c was 6.9 but did not check his blood sugar today he reports.  Objective: AAO x3, NAD DP/PT pulses palpable bilaterally, CRT less than 3 seconds Nails are hypertrophic, dystrophic, brittle, discolored, elongated 10. No surrounding redness or drainage. Tenderness nails 1-5 bilaterally. No open lesions or pre-ulcerative lesions are identified today. No pain with calf compression, swelling, warmth, erythema  Assessment: 50 year old male symptomatic onychomycosis, diabetic foot exam  Plan: -All treatment options discussed with the patient including all alternatives, risks, complications.  -Sharp debrided nails x10 without any complications or bleeding -Discussed daily foot inspection and glucose control. -Patient encouraged to call the office with any questions, concerns, change in symptoms.   Vivi Barrack DPM

## 2020-08-30 ENCOUNTER — Other Ambulatory Visit: Payer: Self-pay

## 2020-08-30 ENCOUNTER — Encounter (INDEPENDENT_AMBULATORY_CARE_PROVIDER_SITE_OTHER): Payer: 59 | Admitting: Ophthalmology

## 2020-08-30 DIAGNOSIS — H43813 Vitreous degeneration, bilateral: Secondary | ICD-10-CM | POA: Diagnosis not present

## 2020-08-30 DIAGNOSIS — H35033 Hypertensive retinopathy, bilateral: Secondary | ICD-10-CM

## 2020-08-30 DIAGNOSIS — E113313 Type 2 diabetes mellitus with moderate nonproliferative diabetic retinopathy with macular edema, bilateral: Secondary | ICD-10-CM | POA: Diagnosis not present

## 2020-08-30 DIAGNOSIS — I10 Essential (primary) hypertension: Secondary | ICD-10-CM

## 2020-09-27 ENCOUNTER — Other Ambulatory Visit: Payer: Self-pay

## 2020-09-27 ENCOUNTER — Encounter (INDEPENDENT_AMBULATORY_CARE_PROVIDER_SITE_OTHER): Payer: 59 | Admitting: Ophthalmology

## 2020-09-27 DIAGNOSIS — H35033 Hypertensive retinopathy, bilateral: Secondary | ICD-10-CM

## 2020-09-27 DIAGNOSIS — I10 Essential (primary) hypertension: Secondary | ICD-10-CM | POA: Diagnosis not present

## 2020-09-27 DIAGNOSIS — H35372 Puckering of macula, left eye: Secondary | ICD-10-CM | POA: Diagnosis not present

## 2020-09-27 DIAGNOSIS — H43813 Vitreous degeneration, bilateral: Secondary | ICD-10-CM

## 2020-09-27 DIAGNOSIS — E113313 Type 2 diabetes mellitus with moderate nonproliferative diabetic retinopathy with macular edema, bilateral: Secondary | ICD-10-CM

## 2020-10-15 ENCOUNTER — Other Ambulatory Visit: Payer: Self-pay | Admitting: *Deleted

## 2020-10-15 DIAGNOSIS — E1142 Type 2 diabetes mellitus with diabetic polyneuropathy: Secondary | ICD-10-CM

## 2020-10-23 MED ORDER — GABAPENTIN 300 MG PO CAPS: 300.0000 mg | ORAL_CAPSULE | Freq: Three times a day (TID) | ORAL | 5 refills | Status: DC

## 2020-10-30 ENCOUNTER — Encounter (INDEPENDENT_AMBULATORY_CARE_PROVIDER_SITE_OTHER): Payer: 59 | Admitting: Ophthalmology

## 2020-10-30 ENCOUNTER — Other Ambulatory Visit: Payer: Self-pay

## 2020-10-30 DIAGNOSIS — E113313 Type 2 diabetes mellitus with moderate nonproliferative diabetic retinopathy with macular edema, bilateral: Secondary | ICD-10-CM

## 2020-10-30 DIAGNOSIS — H43813 Vitreous degeneration, bilateral: Secondary | ICD-10-CM | POA: Diagnosis not present

## 2020-10-30 DIAGNOSIS — H35033 Hypertensive retinopathy, bilateral: Secondary | ICD-10-CM

## 2020-10-30 DIAGNOSIS — I10 Essential (primary) hypertension: Secondary | ICD-10-CM

## 2020-11-26 ENCOUNTER — Other Ambulatory Visit: Payer: Self-pay | Admitting: Internal Medicine

## 2020-11-27 ENCOUNTER — Encounter (INDEPENDENT_AMBULATORY_CARE_PROVIDER_SITE_OTHER): Payer: 59 | Admitting: Ophthalmology

## 2020-11-27 ENCOUNTER — Other Ambulatory Visit: Payer: Self-pay

## 2020-11-27 DIAGNOSIS — H43813 Vitreous degeneration, bilateral: Secondary | ICD-10-CM

## 2020-11-27 DIAGNOSIS — H35033 Hypertensive retinopathy, bilateral: Secondary | ICD-10-CM

## 2020-11-27 DIAGNOSIS — E113313 Type 2 diabetes mellitus with moderate nonproliferative diabetic retinopathy with macular edema, bilateral: Secondary | ICD-10-CM

## 2020-11-27 DIAGNOSIS — I10 Essential (primary) hypertension: Secondary | ICD-10-CM | POA: Diagnosis not present

## 2020-11-27 DIAGNOSIS — H35372 Puckering of macula, left eye: Secondary | ICD-10-CM

## 2020-11-27 NOTE — Telephone Encounter (Signed)
Please arrange DM follow up with Dr. August Saucer if she has an appointment available within the next 4 weeks. If she's not available, then try to arrange DM follow up with another provider here. Thanks

## 2020-12-04 ENCOUNTER — Telehealth: Payer: Self-pay

## 2020-12-04 NOTE — Telephone Encounter (Signed)
DECISION :   Request Reference Number: [id number]. XULTOPHY INJ 100/3.6 is approved through 12/04/2021. Your patient may now fill this prescription and it will be covered.

## 2020-12-04 NOTE — Telephone Encounter (Signed)
PT called  stated he has to pay over $100  for his Xultophy .... I told him to call his Pharmacy and get them to send me the form so I could start a PA for him to try to help him .Marland Kitchen     PA was done on cover my meds and awaiting approval or denial    ( [facility] is reviewing your PA request. Typically an electronic response will be received within 24-72 hours )

## 2020-12-18 ENCOUNTER — Ambulatory Visit (INDEPENDENT_AMBULATORY_CARE_PROVIDER_SITE_OTHER): Payer: 59 | Admitting: Internal Medicine

## 2020-12-18 ENCOUNTER — Encounter: Payer: Self-pay | Admitting: Internal Medicine

## 2020-12-18 ENCOUNTER — Ambulatory Visit (INDEPENDENT_AMBULATORY_CARE_PROVIDER_SITE_OTHER): Payer: 59 | Admitting: Dietician

## 2020-12-18 ENCOUNTER — Other Ambulatory Visit: Payer: Self-pay

## 2020-12-18 VITALS — BP 149/94 | HR 74 | Ht 72.0 in | Wt 282.6 lb

## 2020-12-18 DIAGNOSIS — E1140 Type 2 diabetes mellitus with diabetic neuropathy, unspecified: Secondary | ICD-10-CM

## 2020-12-18 DIAGNOSIS — Z794 Long term (current) use of insulin: Secondary | ICD-10-CM

## 2020-12-18 DIAGNOSIS — I10 Essential (primary) hypertension: Secondary | ICD-10-CM | POA: Diagnosis not present

## 2020-12-18 DIAGNOSIS — Z0000 Encounter for general adult medical examination without abnormal findings: Secondary | ICD-10-CM | POA: Diagnosis not present

## 2020-12-18 DIAGNOSIS — E1142 Type 2 diabetes mellitus with diabetic polyneuropathy: Secondary | ICD-10-CM

## 2020-12-18 DIAGNOSIS — Z23 Encounter for immunization: Secondary | ICD-10-CM

## 2020-12-18 LAB — GLUCOSE, CAPILLARY: Glucose-Capillary: 178 mg/dL — ABNORMAL HIGH (ref 70–99)

## 2020-12-18 LAB — POCT GLYCOSYLATED HEMOGLOBIN (HGB A1C): Hemoglobin A1C: 8.7 % — AB (ref 4.0–5.6)

## 2020-12-18 MED ORDER — DULOXETINE HCL 60 MG PO CPEP
60.0000 mg | ORAL_CAPSULE | Freq: Every day | ORAL | 5 refills | Status: DC
Start: 2020-12-18 — End: 2021-08-18

## 2020-12-18 MED ORDER — ROSUVASTATIN CALCIUM 10 MG PO TABS: 10.0000 mg | ORAL_TABLET | Freq: Every day | ORAL | 11 refills | Status: DC

## 2020-12-18 NOTE — Assessment & Plan Note (Addendum)
Flu shot today. Discussed COVID booster and patient plans to get one at the pharmacy this week. Also has appointment schedule with the ophthalmologist.

## 2020-12-18 NOTE — Progress Notes (Signed)
Freestyle Libre Personal CGM Training  Start [phone]   End time: 1545 Total time: 30 ZARIUS FURR was educated about the following:  -Getting to know device    Herbalist ) -Setting up device , trend arrows - sensor interaction with vitamin C: do not take more than 500 mg vitamin C daily -Setting alert profile (high alert  240 , low alert 85)  setting up reminders (reminders for 6x/day testing  ) -Inserting sensor (patient applied the sensor on his left upper back of arm  himself today with minimal assist. It appeared to be working and in warm up when he left the office) -Calibrating- none required. - using meter when test blood sugar symbol appears -Ending sensor session -Trouble shooting -Tape guide, ability to upload from home with email address (he says he nor wife do email)  -Reviewed insulin dosing from [place]  Patient has [employer] support and my contact information. Follow up was arranged in 2 weeks Norm Parcel, RD 12/18/2020 5:00 PM.

## 2020-12-18 NOTE — Progress Notes (Signed)
    CC: Diabetes follow up  HPI:  Mr.Rapheal H Zorn is a 50 y.o. PMH noted below, who presents to the [facility] for follow up. To see the management of his acute and chronic conditions, please refer to the A&P note under the encounters tab.   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 Reproducible tingling on Jobe test of right arm, negative spurley maneuver. Full range of motion in both shoulders Skin:warm and dry Neuro:alert answering questions appropriately Psych: normal affect   Assessment & Plan:   See Encounters Tab for problem based charting.  Patient seen with Dr. Mikey Bussing

## 2020-12-18 NOTE — Assessment & Plan Note (Addendum)
Patient currently taking lisinopril-HCTZ 20-25. Reports good control at other doctor's visits and cites high stress level due to traffic today. BP elevated to the 150s, came down to 149. Discussed adding another medication but patient would like to check BP at home for two weeks. Planning on keeping a log of BP and following up next week. If elevated at home will need to start new medication at next visit -f/u BP log

## 2020-12-18 NOTE — Patient Instructions (Signed)
Mory,  Way to go caring for your diabetes!!!   I suggest limiting supplemental vitamin C to no more than 250 mg per day. Best to get it from fruits and veggies.  Congrats on using the [employer] 2. You can see if you can download the App in your App store, if you can you can most likely use your phone as your receiver.    Scan your sensor at least 6-8 times a day- every 6-8 hours. The more the better.   Use the trend arrows to make treatment decisions. ( Do I need to eat more or less do I need to take a walk or do I need a snack before I go for a walk? Etc...)    After you scan there is a picture of a pencil- if you tap this you can enter notes that will help you remember what you at. Did- walked the dog?  The Medicine you took , etc....)    [facility] make a follow up in 2 week or later if you feel confident you can apply a new sensor yourself.   If you want to send your blood sugars to [place] without being her- we can discuss uploading from home or if you use your phone there is a code I can give you ior I can send you an invitation that you will then accept.   Have fun! Experiment and call me or [employer] with questions of concerns.  [name] [phone]

## 2020-12-18 NOTE — Assessment & Plan Note (Addendum)
Freestyle libre placed today. Patient's A1c up to 8.7 today due to dietary indiscretion. Patient says that he has been having dental issues and has been limited in his ability to eat healthy foods due to problems chewing but has spoken with the diabetes educator about modifications he can make. Will also continue to follow with the dentist to get his teeth fixed so he is able to resume his normal habits. - follow up in two weeks with freestyle libre - foot exam today - started moderate intensity statin rosuvastatin 10

## 2020-12-18 NOTE — Assessment & Plan Note (Signed)
Patient did not feel like gabapentin helped him much. Will discontinue and start cymbalta 60 daily. Advised patient to try the medicine for a full 8 weeks to notice the full effects.

## 2020-12-18 NOTE — Patient Instructions (Addendum)
Jason Monroe  It was a pleasure seeing you in the clinic today. We talked about your diabetes, your neuropathy, your cholesterol, blood pressure, and the shoulder pain.  Hypertension Please keep a log of your blood pressures twice a day over the next two weeks. If they are elevated at home we will have to think about going up on your medication.  2. Diabetes Your A1c today was up to 8.7. Try to reduce your carbohydrate intake the best you can with the dental issues and continute to work on getting the dental work. Continue taking your insulin and monitoring your sugars with the freestyle libre. Follow up with [name] in 2 weeks.  3. Neuropathy You can stop taking the gabapentin. We are going to start a medication called cymbalta 60 daily. Take this for 8 weeks to see the full effect.  4. Cholesterol We are going to start a low dose of a cholesterol medication call rosuvastatin. Take this once a day.  5. Shoulder Numbness This is likely a condition called shoulder impingement. When it flares up you can rest it and use an over the counter gel called voltaren. If you have decreased strength or range of motion in your arm this would be a reason to get checked out further.   Please call our clinic at [phone] if you have any questions or concerns. The best time to call is Monday-Friday from 9am-4pm, but there is someone available 24/7 at the same number. If you need medication refills, please notify your pharmacy one week in advance and they will send [place] a request.   Thank you for letting us take part in your care. We look forward to seeing you next time!

## 2020-12-19 ENCOUNTER — Telehealth: Payer: Self-pay | Admitting: Dietician

## 2020-12-19 LAB — LIPID PANEL
Chol/HDL Ratio: 3.1 ratio (ref 0.0–5.0)
Cholesterol, Total: 135 mg/dL (ref 100–199)
HDL: 44 mg/dL (ref >39)
LDL Chol Calc (NIH): 75 mg/dL (ref 0–99)
Triglycerides: 81 mg/dL (ref 0–149)
VLDL Cholesterol Cal: 16 mg/dL (ref 5–40)

## 2020-12-19 LAB — VITAMIN B12: Vitamin B-12: 681 pg/mL (ref 232–1245)

## 2020-12-19 NOTE — Telephone Encounter (Signed)
Rescheduled CGM follow up appointment.

## 2020-12-24 NOTE — Progress Notes (Signed)
Internal Medicine Clinic Attending  I saw and evaluated the patient.  I personally confirmed the key portions of the history and exam documented by Dr. DeMaio   and I reviewed pertinent patient test results.  The assessment, diagnosis, and plan were formulated together and I agree with the documentation in the resident's note.  

## 2020-12-25 ENCOUNTER — Encounter (INDEPENDENT_AMBULATORY_CARE_PROVIDER_SITE_OTHER): Payer: 59 | Admitting: Ophthalmology

## 2020-12-25 ENCOUNTER — Other Ambulatory Visit: Payer: Self-pay

## 2020-12-25 DIAGNOSIS — E113313 Type 2 diabetes mellitus with moderate nonproliferative diabetic retinopathy with macular edema, bilateral: Secondary | ICD-10-CM

## 2020-12-25 DIAGNOSIS — H35033 Hypertensive retinopathy, bilateral: Secondary | ICD-10-CM | POA: Diagnosis not present

## 2020-12-25 DIAGNOSIS — H43813 Vitreous degeneration, bilateral: Secondary | ICD-10-CM | POA: Diagnosis not present

## 2020-12-25 DIAGNOSIS — I10 Essential (primary) hypertension: Secondary | ICD-10-CM | POA: Diagnosis not present

## 2020-12-25 DIAGNOSIS — H2513 Age-related nuclear cataract, bilateral: Secondary | ICD-10-CM

## 2021-01-01 ENCOUNTER — Encounter: Payer: Self-pay | Admitting: Dietician

## 2021-01-01 ENCOUNTER — Ambulatory Visit (INDEPENDENT_AMBULATORY_CARE_PROVIDER_SITE_OTHER): Payer: 59 | Admitting: Dietician

## 2021-01-01 DIAGNOSIS — E1140 Type 2 diabetes mellitus with diabetic neuropathy, unspecified: Secondary | ICD-10-CM | POA: Diagnosis not present

## 2021-01-01 DIAGNOSIS — Z794 Long term (current) use of insulin: Secondary | ICD-10-CM

## 2021-01-01 NOTE — Progress Notes (Signed)
Medical Nutrition Therapy:  Appt start time: 1330 end time:  1415. Total time: 45 Visit # 1  Assessment:  Primary concerns today: patient is here with his mother for support with stopping and removing his old sensor and placing and starting a new one. He and his mother were able to place a new sensor on his outer left back of arm with minimal assistance.  I downloaded his reader and we reviewed the past 2 weeks of data. He was able to identify the patterns and possible reasons for the peaks above target and below target. He made a plan to address both of these by decreasing his insulin by 2 units until he sees the doctor on 01/06/21 and being aware of what and how much he eats at meals/snacks to lower his peaks. This will help promote weight maintenance/loss vs asking him to snack more frequently may promote unintentional weight gain. Suggest maximizing GLP-1 using a weekly formulation and adding [place] alone to be better able to titrate both to promote appropriate weight loss and improve blood sugar control and cardiac health. He is currently getting ~ 1.1 mg liraglutide per day  Preferred Learning Style: No preference indicated  Learning Readiness: Ready and change in progress   ANTHROPOMETRICS: He declined weighing today.  Estimated body mass index is 38.33 kg/m as calculated from the following:   Height as of 12/18/20: 6' (1.829 m).   Weight as of 12/18/20: 282 lb 9.6 oz (128.2 kg).  WEIGHT HISTORY:  Wt Readings from Last 10 Encounters:  12/18/20 282 lb 9.6 oz (128.2 kg)  08/05/20 275 lb 14.4 oz (125.1 kg)  03/06/20 272 lb 14.4 oz (123.8 kg)  10/09/19 270 lb 11.2 oz (122.8 kg)  07/26/19 271 lb 1.6 oz (123 kg)  04/03/19 274 lb 11.2 oz (124.6 kg)  01/02/19 278 lb (126.1 kg)  08/22/18 279 lb 12.8 oz (126.9 kg)  06/24/18 273 lb 8 oz (124.1 kg)  05/27/18 273 lb 8 oz (124.1 kg)    SLEEP:during the daytime after he eats breakfast until about 530 Pm  MEDICATIONS: has been taking 30 units  of Xultophy since last visit (despite knowing order was for 40 units daily)  BLOOD SUGAR: Lab Results  Component Value Date   HGBA1C 8.7 (A) 12/18/2020   HGBA1C 7.0 (A) 08/05/2020   HGBA1C 7.4 (A) 03/06/2020   HGBA1C 6.7 (A) 10/09/2019   HGBA1C 6.0 (A) 07/26/2019     CGM Results from download:   % Time CGM active:   99 %   (Goal >70%)  Average glucose:   147 mg/dL for 14 days  Glucose management indicator:   6.8 %  Time in range (70-180 mg/dL):   76 %   (Goal >16%)  Time High (181-250 mg/dL):   20 %   (Goal < 10%)  Time Very High (>250 mg/dL):    1 %   (Goal < 5%)  Time Low (54-69 mg/dL):   2 %   (Goal <9%)  Time Very Low (<54 mg/dL):   1 %   (Goal <6%)  Coefficient of variation:   28.7 %   (Goal <36%)   Overall his download shows excellent diabetes control.  Notes Excess hypoglycemia 100% while at work. Eats before work, but has difficult time getting a snack. Above goal after 2 main meals.Marland Kitchen   DIETARY INTAKE: Usual eating pattern includes 3 meals and 0-2 snacks per day. Everyday foods include most soft food right now until he gets dentures.  Avoided  foods include crunchy and hard foods.   Dining Out (times/week): not assessed 24-hr recall:  B ( 8-9 AM): need to assess at future visit  L ( 6 PM): need to assess what and how much at future visit D ( 1-3 AM): "snack"  Snk ( PM): he is a work and says it is hard to get a snack there. Beverages: need to assess at future visit  Usual physical activity: work  Progress Towards Goal(s):  Some progress.   Nutritional Diagnosis:  NB-1.4 Self-monitoring deficit As related to lack of sufficient self monitoring prior to CGM to detect patterns.  As evidenced by his report today.    Intervention:  Nutrition education about how to use CGM , how to read a report, goal setting and diabetes support provided with encouraing healthy attitue about diabetes and healthy food choices.  Action Goal: his plan is to do a home experiment by lowering  his Xultophy by 2 units for 4-7 days to see if this helps decrease his hypoglycemia. We also made plans for easy and healthy snacks he can take to work with him.  Outcome goal: improved diabetes self management Coordination of care: discuss with Dr. Huel Cote  Teaching Method Utilized: Visual, Auditory,Hands on Handouts given during visit include:freestyle libre information about downloading his reader from home, coupons for Splenda shakes to use as snacks at work Barriers to learning/adherence to lifestyle change: none noted today Demonstrated degree of understanding via:  Teach Back   Monitoring/Evaluation:  Dietary intake, exercise, reader, and body weight prn and at least annually. Norm Parcel, RD 01/01/2021 3:52 PM.

## 2021-01-06 ENCOUNTER — Telehealth: Payer: Self-pay

## 2021-01-06 ENCOUNTER — Other Ambulatory Visit: Payer: Self-pay

## 2021-01-06 ENCOUNTER — Ambulatory Visit (INDEPENDENT_AMBULATORY_CARE_PROVIDER_SITE_OTHER): Payer: 59 | Admitting: Internal Medicine

## 2021-01-06 ENCOUNTER — Encounter: Payer: Self-pay | Admitting: Internal Medicine

## 2021-01-06 DIAGNOSIS — I10 Essential (primary) hypertension: Secondary | ICD-10-CM | POA: Diagnosis not present

## 2021-01-06 DIAGNOSIS — Z794 Long term (current) use of insulin: Secondary | ICD-10-CM | POA: Diagnosis not present

## 2021-01-06 DIAGNOSIS — E1140 Type 2 diabetes mellitus with diabetic neuropathy, unspecified: Secondary | ICD-10-CM

## 2021-01-06 MED ORDER — TRULICITY 1.5 MG/0.5ML ~~LOC~~ SOAJ: 1.5000 mg | SUBCUTANEOUS | 1 refills | Status: DC

## 2021-01-06 MED ORDER — INSULIN GLARGINE 100 UNIT/ML SOLOSTAR PEN
30.0000 [IU] | PEN_INJECTOR | Freq: Every day | SUBCUTANEOUS | 0 refills | Status: DC
Start: 2021-01-06 — End: 2021-02-03

## 2021-01-06 MED ORDER — LISINOPRIL-HYDROCHLOROTHIAZIDE 20-12.5 MG PO TABS: 2.0000 | ORAL_TABLET | Freq: Every day | ORAL | 5 refills | Status: DC

## 2021-01-06 NOTE — Telephone Encounter (Signed)
PA  for pt ( TRULICITY 1.5 MG/ 0.5 ML PEN INJECTORS ) came through on cover my meds was done and sent back with office notes from 11/7 along with last A1C . Awaiting approval or denial

## 2021-01-06 NOTE — Assessment & Plan Note (Signed)
Mr. Cardoza states that he has been doing well on his current regimen that includes Xultophy 30 units daily.  He has met with Butch Penny and downloaded his for CGM reading that shows he is within range approximately 70%, but above range approximately 20% per Donna's notes.  However, patient did have episodes of hypoglycemia especially while at work.  Mr. Reiling endorses noticing these hypoglycemic events.  We discussed dividing his Xultophy into the separate long-acting insulin and GLP-1 and he is in agreement.   Assessment/plan: To allow for ease of titration, will discontinue Xultophy in favor of separate long-acting insulin and a GLP-1.  Given patient is already having to endure a daily injection, he would benefit from a weekly GLP-1 rather than a daily.  He will be meeting with Butch Penny again in approximately 1 week and I recommend a 4-week follow-up with [place].  - Discontinue Xultophy - Start Lantus 30 units daily - Start Trulicity 1.5 mg weekly - 4-week follow-up - Continue following up with Butch Penny

## 2021-01-06 NOTE — Patient Instructions (Addendum)
It was nice seeing you today! Thank you for choosing [facility] for your Primary Care.    Today we talked about:   High blood pressure: We are increasing your Lisinopril portion of the combination pill you are on. Please take 2 tablets daily. You can take them at the same time.   Diabetes: We are splitting the Xultophy into a long acting insulin (Lantus) and a weekly injection called Trulicity. With the trulicity, you may experience some GI side effects during the first few days, but this will subside.  The Lantus will stay at 30 units daily .    Let's follow up in 4 weeks.

## 2021-01-06 NOTE — Assessment & Plan Note (Signed)
Jason Monroe states that at his last visit, they discussed increasing his current antihypertensive regimen, however he wanted to hold off until he can check his blood pressure log at home for 2 weeks.  He notes that his average SBP at home is in the 140s but goes as high as 150.  His DBP is between 80-100.  He is in agreement to increase his blood pressure medication at this time.  Assessment/plan: BP: (!) 145/99  Blood pressure remains above goal.  We will increase lisinopril to 40 mg from 20 mg and keep current HCTZ dose.  - Discontinue lisinopril-HCTZ 20-25 mg daily - Start lisinopril-HCTZ 40- 25 mg daily - 4-week follow-up

## 2021-01-06 NOTE — Progress Notes (Signed)
   CC: T2DM, HTN  HPI:  Mr.Tallie H Hellen is a 50 y.o. with a PMHx as listed below who presents to the clinic for T2DM, HTN.   Please see the Encounters tab for problem-based Assessment & Plan regarding status of patient's acute and chronic conditions.  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  Respiratory:  Negative for cough and shortness of breath.   Cardiovascular:  Negative for chest pain and leg swelling.  Gastrointestinal:  Negative for abdominal pain, diarrhea, nausea and vomiting.   Physical Exam:  Vitals:   01/06/21 1438  BP: (!) 145/99  Pulse: 80  Temp: 98 F (36.7 C)  TempSrc: Oral  SpO2: 97%  Weight: 281 lb 12.8 oz (127.8 kg)  Height: 6' (1.829 m)   Physical Exam Vitals and nursing note reviewed.  Constitutional:      General: He is not in acute distress. HENT:     Head: Normocephalic and atraumatic.  Eyes:     Conjunctiva/sclera: Conjunctivae normal.     Pupils: Pupils are equal, round, and reactive to light.  Pulmonary:     Effort: Pulmonary effort is normal. No respiratory distress.  Skin:    General: Skin is warm and dry.  Neurological:     General: No focal deficit present.     Mental Status: He is alert and oriented to person, place, and time.     Gait: Gait normal.  Psychiatric:        Mood and Affect: Mood normal.        Behavior: Behavior normal.    Assessment & Plan:   See Encounters Tab for problem based charting.  Patient discussed with Dr. Criselda Peaches

## 2021-01-07 NOTE — Telephone Encounter (Signed)
DECISION :    Approved on November 7  Request Reference Number: [id number].   TRULICITY INJ 1.5/0.5 is approved through 01/06/2022. Your patient may now fill this prescription and it will be covered.  Drug  Trulicity 1.5MG /0.5ML pen-injectors  Form [facility] Electronic Prior Authorization Form (2017 NCPDP)  Original Claim Info 75    ( COPY SENT TO PHARMACY ALSO )

## 2021-01-08 ENCOUNTER — Other Ambulatory Visit: Payer: Self-pay

## 2021-01-10 NOTE — Progress Notes (Signed)
Internal Medicine Clinic Attending  Case discussed with Dr. Basaraba  At the time of the visit.  We reviewed the resident's history and exam and pertinent patient test results.  I agree with the assessment, diagnosis, and plan of care documented in the resident's note.  

## 2021-01-13 ENCOUNTER — Encounter: Payer: 59 | Admitting: Dietician

## 2021-01-21 ENCOUNTER — Encounter (INDEPENDENT_AMBULATORY_CARE_PROVIDER_SITE_OTHER): Payer: 59 | Admitting: Ophthalmology

## 2021-01-21 ENCOUNTER — Other Ambulatory Visit: Payer: Self-pay

## 2021-01-21 DIAGNOSIS — H35372 Puckering of macula, left eye: Secondary | ICD-10-CM | POA: Diagnosis not present

## 2021-01-21 DIAGNOSIS — H35033 Hypertensive retinopathy, bilateral: Secondary | ICD-10-CM | POA: Diagnosis not present

## 2021-01-21 DIAGNOSIS — E113313 Type 2 diabetes mellitus with moderate nonproliferative diabetic retinopathy with macular edema, bilateral: Secondary | ICD-10-CM

## 2021-01-21 DIAGNOSIS — I10 Essential (primary) hypertension: Secondary | ICD-10-CM | POA: Diagnosis not present

## 2021-01-21 DIAGNOSIS — H43813 Vitreous degeneration, bilateral: Secondary | ICD-10-CM

## 2021-02-03 ENCOUNTER — Encounter: Payer: Self-pay | Admitting: Internal Medicine

## 2021-02-03 ENCOUNTER — Other Ambulatory Visit: Payer: Self-pay

## 2021-02-03 ENCOUNTER — Ambulatory Visit (INDEPENDENT_AMBULATORY_CARE_PROVIDER_SITE_OTHER): Payer: 59 | Admitting: Internal Medicine

## 2021-02-03 VITALS — BP 155/93 | HR 76 | Temp 98.3°F | Resp 20 | Ht 72.0 in | Wt 279.3 lb

## 2021-02-03 DIAGNOSIS — E1140 Type 2 diabetes mellitus with diabetic neuropathy, unspecified: Secondary | ICD-10-CM

## 2021-02-03 DIAGNOSIS — Z794 Long term (current) use of insulin: Secondary | ICD-10-CM

## 2021-02-03 DIAGNOSIS — I10 Essential (primary) hypertension: Secondary | ICD-10-CM | POA: Diagnosis not present

## 2021-02-03 MED ORDER — TRULICITY 1.5 MG/0.5ML ~~LOC~~ SOAJ: 1.5000 mg | SUBCUTANEOUS | 1 refills | Status: DC

## 2021-02-03 MED ORDER — INSULIN GLARGINE 100 UNIT/ML SOLOSTAR PEN
17.0000 [IU] | PEN_INJECTOR | Freq: Two times a day (BID) | SUBCUTANEOUS | 0 refills | Status: DC
Start: 2021-02-03 — End: 2021-02-25

## 2021-02-03 MED ORDER — FREESTYLE LIBRE 2 READER DEVI: 0 refills | Status: DC

## 2021-02-03 NOTE — Progress Notes (Signed)
   CC: diabetes and BP follow-up  HPI:  [name] is a 50 y.o. male with past medical history as detailed below who presents for 4 week follow-up for diabetes and blood pressure management. Please see problem-based charting for detailed assessment and plan.  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   Cardiovascular:  Negative for chest pain.  Gastrointestinal:  Negative for abdominal pain, constipation and diarrhea.  Genitourinary:  Negative for dysuria and frequency.  Neurological:  Negative for dizziness, weakness and headaches.    Physical Exam:  Vitals:   02/03/21 1352  BP: (!) 144/89  Pulse: 86  Resp: 20  Temp: 98.3 F (36.8 C)  TempSrc: Oral  SpO2: 99%  Weight: 279 lb 4.8 oz (126.7 kg)  Height: 6' (1.829 m)   Constitutional: Well appearing gentleman, no acute distress noted. Cardio: Regular rate and rhythm. No murmurs, rubs, gallops. Pulm: Clear to auscultation bilaterally. Abdomen: Soft, non-tender, non-distended. MSK: No extremity edema noted. Skin: Warm and dry. Neuro: Alert and oriented x3. No focal deficit noted. Psych: Appropriate mood and affect.  Assessment & Plan:   See Encounters Tab for problem based charting.  Patient seen with Dr. Criselda Peaches

## 2021-02-03 NOTE — Patient Instructions (Addendum)
Thank you for visiting the [facility] Medicine Clinic today. It was a pleasure to meet you! Today we discussed your diabetes and blood pressure control. I am glad to hear that you are working diligently to improve your health, keep up the hard work!  Your blood sugars continue to be above goal. For this, I'd like to adjust your current regimen to: Please begin taking 17 units of Lantus twice daily. Continue taking Trulicity 1.5 mg weekly.  Your blood pressure readings are also above goal though they seem to be improving.  I have ordered the following for you:  Medication changes: Lantus: Please begin taking 17 units of Lantus twice daily.   Follow-up: In 1-2 weeks. Please bring in your blood pressure log as well as your home blood pressure cuff to this visit.  Remember: If you have any questions or concerns, please call our clinic at [phone] between 9am-5pm and after hours call [phone] and ask for the internal medicine resident on call. If you feel you are having a medical emergency please call 911.  Champ Mungo, DO

## 2021-02-03 NOTE — Assessment & Plan Note (Addendum)
Assessment: Patient's BP continues to be above goal with reading of 144/89 today. He reports that his home daily measurements have improved from his previous visit around one month ago to systolic in the 130s-140s. He also endorses higher stress levels over the last few weeks with the holidays, family being in town, and hosting in his new home for the first time. Plan: Continue current regimen of lisinopril-HCTZ 40-25 mg daily. I have asked him to keep a log of his daily blood pressure readings and to return to clinic in 2 weeks. I have asked that at that time, he bring in his log as well as his home cuff so that we can make sure that it is accurate. I have also counseled him on ibuprofen use which he endorses minimal use for pain as he understands it can affect his blood pressure and kidneys which he wants to avoid.

## 2021-02-03 NOTE — Assessment & Plan Note (Addendum)
Assessment: Patient reports that he feels overall that his control is improving though does acknowledge that with Thanksgiving recently he has not had as good of control as he knows that he should. He also acknowledges less ideal diet choices over the weekend which is when he has above goal readings consistently as well. Fortunately he has not had any hypoglycemic episodes in this time.  Libre report 01/21/2021-02/03/2021 Average glucose 162 Glucose variability 28.0% Very High 3% High 32% Target range 65% Low 0% Very low 0%  Plan: Continue Trulicity 1.5 mg weekly. Patient has been instructed to change Lantus dosing to 17 units twice daily, which will increase total dose from 30>34 units.

## 2021-02-07 NOTE — Progress Notes (Signed)
Internal Medicine Clinic Attending  I saw and evaluated the patient.  I personally confirmed the key portions of the history and exam documented by Dr.  Dean  and I reviewed pertinent patient test results.  The assessment, diagnosis, and plan were formulated together and I agree with the documentation in the resident's note.  

## 2021-02-18 ENCOUNTER — Encounter (INDEPENDENT_AMBULATORY_CARE_PROVIDER_SITE_OTHER): Payer: 59 | Admitting: Ophthalmology

## 2021-02-18 ENCOUNTER — Other Ambulatory Visit: Payer: Self-pay

## 2021-02-18 DIAGNOSIS — E113313 Type 2 diabetes mellitus with moderate nonproliferative diabetic retinopathy with macular edema, bilateral: Secondary | ICD-10-CM | POA: Diagnosis not present

## 2021-02-18 DIAGNOSIS — I10 Essential (primary) hypertension: Secondary | ICD-10-CM | POA: Diagnosis not present

## 2021-02-18 DIAGNOSIS — H43813 Vitreous degeneration, bilateral: Secondary | ICD-10-CM

## 2021-02-18 DIAGNOSIS — H35033 Hypertensive retinopathy, bilateral: Secondary | ICD-10-CM | POA: Diagnosis not present

## 2021-02-18 DIAGNOSIS — H35372 Puckering of macula, left eye: Secondary | ICD-10-CM

## 2021-02-19 ENCOUNTER — Encounter: Payer: 59 | Admitting: Internal Medicine

## 2021-02-20 ENCOUNTER — Ambulatory Visit: Payer: 59 | Admitting: Podiatry

## 2021-02-25 ENCOUNTER — Other Ambulatory Visit: Payer: Self-pay

## 2021-02-25 MED ORDER — INSULIN GLARGINE 100 UNIT/ML SOLOSTAR PEN: 17.0000 [IU] | PEN_INJECTOR | Freq: Two times a day (BID) | SUBCUTANEOUS | 0 refills | Status: DC

## 2021-03-10 ENCOUNTER — Other Ambulatory Visit: Payer: Self-pay

## 2021-03-10 ENCOUNTER — Encounter: Payer: Self-pay | Admitting: Internal Medicine

## 2021-03-10 ENCOUNTER — Ambulatory Visit (INDEPENDENT_AMBULATORY_CARE_PROVIDER_SITE_OTHER): Payer: Managed Care, Other (non HMO) | Admitting: Internal Medicine

## 2021-03-10 VITALS — BP 125/85 | HR 91 | Temp 98.2°F | Resp 28 | Ht 72.0 in | Wt 274.9 lb

## 2021-03-10 DIAGNOSIS — E1140 Type 2 diabetes mellitus with diabetic neuropathy, unspecified: Secondary | ICD-10-CM

## 2021-03-10 DIAGNOSIS — Z794 Long term (current) use of insulin: Secondary | ICD-10-CM

## 2021-03-10 DIAGNOSIS — I10 Essential (primary) hypertension: Secondary | ICD-10-CM

## 2021-03-10 LAB — POCT GLYCOSYLATED HEMOGLOBIN (HGB A1C): Hemoglobin A1C: 8.1 % — AB (ref 4.0–5.6)

## 2021-03-10 LAB — GLUCOSE, CAPILLARY: Glucose-Capillary: 148 mg/dL — ABNORMAL HIGH (ref 70–99)

## 2021-03-10 MED ORDER — XULTOPHY 100-3.6 UNIT-MG/ML ~~LOC~~ SOPN: 30.0000 [IU] | PEN_INJECTOR | Freq: Every day | SUBCUTANEOUS | 1 refills | Status: DC

## 2021-03-10 NOTE — Progress Notes (Signed)
° °  Office Visit   Patient ID: Jason Monroe, male    DOB: 1970-07-24, 51 y.o.   MRN: [id number]   PCP: Farrel Gordon, DO   Subjective:   Jason Monroe is a 51 y.o. year old male with hypertension and diabetes who presents for blood pressure follow up. Please refer to problem based charting for assessment and plan.  Objective:   BP 125/85 (BP Location: Left Arm, Patient Position: Sitting, Cuff Size: Large)    Pulse 91    Temp 98.2 F (36.8 C) (Oral)    Resp (!) 28    Ht 6' (1.829 m)    Wt 274 lb 14.4 oz (124.7 kg)    SpO2 100% Comment: room air   BMI 37.28 kg/m   General: well appearing, no distress Cardiac: RRR Pulm: breathing comfortably on room air Assessment & Plan:   Problem List Items Addressed This Visit       Cardiovascular and Mediastinum   Essential hypertension - Primary   Relevant Orders   Basic metabolic panel     Endocrine   Diabetes mellitus with diabetic neuropathy, with long-term current use of insulin (HCC)   Relevant Medications   Insulin Degludec-Liraglutide (XULTOPHY) 100-3.6 UNIT-MG/ML SOPN   Other Relevant Orders   POC Hbg A1C (Completed)     Return in about 2 weeks (around 03/24/2021) for Diabetes follow up.   Pt discussed with Dr. Marty Heck, MD Internal Medicine Resident PGY-3 [facility] 03/10/2021 8:49 PM

## 2021-03-10 NOTE — Assessment & Plan Note (Signed)
Pt presents for 2w BP follow up. Unfortunately, he forgot the logbook at home today, however he notes that home blood pressures are labile, ranging from [id number] systolically. It sounds like he may not always be relaxed when taking these however, which is likely playing a role in the higher end of the blood pressures. Blood pressure is at goal in the office today--125/85.  -Continue current management with lisinopril-hctz 40-25mg  daily -Due for BMP

## 2021-03-10 NOTE — Assessment & Plan Note (Addendum)
Currently on Trulicity 1.5mg  weekly, lantus 17U BID.  Since his last office visit, he notes intermittent hypoglycemic events. Unfortunately, his freestyle glucometer became damaged around christmas so I am unable to further delve into the times and patterns of this occurring. He is picking up the replacement today. He additionally notes that he would like to switch back to xultophy. He notes that he has been experiencing some general malaise since starting Trulicity back in October. I discussed with him that this would mean that we would discontinue both the trulicity and lantus and that we would need him to have follow up visits to adjust the Xultophy to ensure adequate control.  Plan -D/c Trulicity and lantus -Resume Xultophy at 30U (prior dose) -He will drop off his meter to have it downloaded in two weeks, at which time we can make additional adjustments and look further into the hypoglycemic events. He seems to have good enough insight into diabetes that we will likely be able to have him self titrate after the initial CGM review -Since he did not tolerate metformin and GLP-1, we could also consider the addition of SGLT2 if needed.

## 2021-03-11 ENCOUNTER — Encounter: Payer: Self-pay | Admitting: Internal Medicine

## 2021-03-11 LAB — BASIC METABOLIC PANEL
BUN/Creatinine Ratio: 17 (ref 9–20)
BUN: 17 mg/dL (ref 6–24)
CO2: 26 mmol/L (ref 20–29)
Calcium: 9.8 mg/dL (ref 8.7–10.2)
Chloride: 97 mmol/L (ref 96–106)
Creatinine, Ser: 1.01 mg/dL (ref 0.76–1.27)
Glucose: 144 mg/dL — ABNORMAL HIGH (ref 70–99)
Potassium: 4.6 mmol/L (ref 3.5–5.2)
Sodium: 139 mmol/L (ref 134–144)
eGFR: 91 mL/min/{1.73_m2} (ref >59)

## 2021-03-11 NOTE — Progress Notes (Signed)
Internal Medicine Clinic Attending  Case discussed with Dr. Christian  At the time of the visit.  We reviewed the resident's history and exam and pertinent patient test results.  I agree with the assessment, diagnosis, and plan of care documented in the resident's note.  

## 2021-03-18 ENCOUNTER — Encounter (INDEPENDENT_AMBULATORY_CARE_PROVIDER_SITE_OTHER): Payer: Managed Care, Other (non HMO) | Admitting: Ophthalmology

## 2021-03-18 ENCOUNTER — Other Ambulatory Visit: Payer: Self-pay

## 2021-03-18 ENCOUNTER — Ambulatory Visit: Payer: 59 | Admitting: Podiatry

## 2021-03-18 DIAGNOSIS — E113313 Type 2 diabetes mellitus with moderate nonproliferative diabetic retinopathy with macular edema, bilateral: Secondary | ICD-10-CM | POA: Diagnosis not present

## 2021-03-18 DIAGNOSIS — H35372 Puckering of macula, left eye: Secondary | ICD-10-CM

## 2021-03-18 DIAGNOSIS — I10 Essential (primary) hypertension: Secondary | ICD-10-CM | POA: Diagnosis not present

## 2021-03-18 DIAGNOSIS — H35033 Hypertensive retinopathy, bilateral: Secondary | ICD-10-CM

## 2021-03-18 DIAGNOSIS — H2513 Age-related nuclear cataract, bilateral: Secondary | ICD-10-CM

## 2021-03-18 DIAGNOSIS — H43813 Vitreous degeneration, bilateral: Secondary | ICD-10-CM

## 2021-03-24 ENCOUNTER — Other Ambulatory Visit: Payer: Self-pay

## 2021-03-24 ENCOUNTER — Ambulatory Visit (INDEPENDENT_AMBULATORY_CARE_PROVIDER_SITE_OTHER): Payer: Managed Care, Other (non HMO) | Admitting: Podiatry

## 2021-03-24 DIAGNOSIS — B351 Tinea unguium: Secondary | ICD-10-CM

## 2021-03-24 DIAGNOSIS — M79675 Pain in left toe(s): Secondary | ICD-10-CM | POA: Diagnosis not present

## 2021-03-24 DIAGNOSIS — M79674 Pain in right toe(s): Secondary | ICD-10-CM | POA: Diagnosis not present

## 2021-03-24 DIAGNOSIS — E1149 Type 2 diabetes mellitus with other diabetic neurological complication: Secondary | ICD-10-CM

## 2021-03-24 DIAGNOSIS — E1142 Type 2 diabetes mellitus with diabetic polyneuropathy: Secondary | ICD-10-CM

## 2021-03-24 DIAGNOSIS — L603 Nail dystrophy: Secondary | ICD-10-CM

## 2021-03-27 ENCOUNTER — Other Ambulatory Visit: Payer: Self-pay

## 2021-03-27 MED ORDER — LISINOPRIL-HYDROCHLOROTHIAZIDE 20-12.5 MG PO TABS: 2.0000 | ORAL_TABLET | Freq: Every day | ORAL | 5 refills | Status: DC

## 2021-03-28 NOTE — Progress Notes (Signed)
Subjective: 51 year old male presents the office today for diabetic foot evaluation.  He has stopped gabapentin.  Denies any open sores.  No claudication symptoms.  He has no new concerns today.  No fevers or chills.  His last A1c was 8.1 on March 10, 2021  Objective: AAO x3, NAD DP/PT pulses palpable bilaterally, CRT less than 3 seconds Nails are hypertrophic, dystrophic, brittle, discolored, elongated 10. Tenderness nails 1-5 bilaterally.  The left third toenail was split along medial aspect and there is localized faint edema, erythema.  There is no drainage or pus or ascending cellulitis.  No edema, erythema to the other toenail sites.  No open lesions or pre-ulcerative lesions are identified today. No pain with calf compression, swelling, warmth, erythema  Assessment: 51 year old male symptomatic onychomycosis, diabetic foot exam; left third toenail split  Plan: -All treatment options discussed with the patient including all alternatives, risks, complications.  -Sharp debrided nails x10 without any complications or bleeding.  Was able to debride the loose portion of the left third digit without any complications or bleeding.  Recommend a small amount of antibiotic ointment dressing changes daily.  Monitor closely for any signs or symptoms of infection.  If not resolving next week to let me know or sooner if there is any worsening. -Discussed daily foot inspection and glucose control. -Patient encouraged to call the office with any questions, concerns, change in symptoms.   Return in about 6 months (around 09/21/2021).  Trula Slade DPM

## 2021-04-09 ENCOUNTER — Telehealth: Payer: Self-pay

## 2021-04-15 ENCOUNTER — Other Ambulatory Visit: Payer: Self-pay

## 2021-04-15 ENCOUNTER — Encounter (INDEPENDENT_AMBULATORY_CARE_PROVIDER_SITE_OTHER): Payer: Managed Care, Other (non HMO) | Admitting: Ophthalmology

## 2021-04-15 DIAGNOSIS — E113313 Type 2 diabetes mellitus with moderate nonproliferative diabetic retinopathy with macular edema, bilateral: Secondary | ICD-10-CM

## 2021-04-15 DIAGNOSIS — H43813 Vitreous degeneration, bilateral: Secondary | ICD-10-CM

## 2021-04-15 DIAGNOSIS — H35372 Puckering of macula, left eye: Secondary | ICD-10-CM | POA: Diagnosis not present

## 2021-04-15 DIAGNOSIS — H35033 Hypertensive retinopathy, bilateral: Secondary | ICD-10-CM

## 2021-04-15 DIAGNOSIS — I10 Essential (primary) hypertension: Secondary | ICD-10-CM

## 2021-04-15 DIAGNOSIS — H2513 Age-related nuclear cataract, bilateral: Secondary | ICD-10-CM

## 2021-05-22 ENCOUNTER — Other Ambulatory Visit: Payer: Self-pay

## 2021-05-22 ENCOUNTER — Encounter (INDEPENDENT_AMBULATORY_CARE_PROVIDER_SITE_OTHER): Payer: Managed Care, Other (non HMO) | Admitting: Ophthalmology

## 2021-05-22 DIAGNOSIS — H43813 Vitreous degeneration, bilateral: Secondary | ICD-10-CM | POA: Diagnosis not present

## 2021-05-22 DIAGNOSIS — I10 Essential (primary) hypertension: Secondary | ICD-10-CM

## 2021-05-22 DIAGNOSIS — H35372 Puckering of macula, left eye: Secondary | ICD-10-CM

## 2021-05-22 DIAGNOSIS — H35033 Hypertensive retinopathy, bilateral: Secondary | ICD-10-CM | POA: Diagnosis not present

## 2021-05-22 DIAGNOSIS — E113313 Type 2 diabetes mellitus with moderate nonproliferative diabetic retinopathy with macular edema, bilateral: Secondary | ICD-10-CM | POA: Diagnosis not present

## 2021-05-22 DIAGNOSIS — H2513 Age-related nuclear cataract, bilateral: Secondary | ICD-10-CM

## 2021-06-10 ENCOUNTER — Other Ambulatory Visit: Payer: Self-pay

## 2021-06-11 MED ORDER — XULTOPHY 100-3.6 UNIT-MG/ML ~~LOC~~ SOPN: 30.0000 [IU] | PEN_INJECTOR | Freq: Every day | SUBCUTANEOUS | 0 refills | Status: DC

## 2021-06-26 ENCOUNTER — Encounter (INDEPENDENT_AMBULATORY_CARE_PROVIDER_SITE_OTHER): Payer: Managed Care, Other (non HMO) | Admitting: Ophthalmology

## 2021-06-26 DIAGNOSIS — H35033 Hypertensive retinopathy, bilateral: Secondary | ICD-10-CM

## 2021-06-26 DIAGNOSIS — H43813 Vitreous degeneration, bilateral: Secondary | ICD-10-CM | POA: Diagnosis not present

## 2021-06-26 DIAGNOSIS — H35372 Puckering of macula, left eye: Secondary | ICD-10-CM

## 2021-06-26 DIAGNOSIS — E113313 Type 2 diabetes mellitus with moderate nonproliferative diabetic retinopathy with macular edema, bilateral: Secondary | ICD-10-CM | POA: Diagnosis not present

## 2021-06-26 DIAGNOSIS — I10 Essential (primary) hypertension: Secondary | ICD-10-CM | POA: Diagnosis not present

## 2021-07-16 ENCOUNTER — Telehealth: Payer: Self-pay | Admitting: *Deleted

## 2021-07-17 ENCOUNTER — Ambulatory Visit (INDEPENDENT_AMBULATORY_CARE_PROVIDER_SITE_OTHER): Payer: Managed Care, Other (non HMO) | Admitting: Podiatry

## 2021-07-17 ENCOUNTER — Ambulatory Visit (INDEPENDENT_AMBULATORY_CARE_PROVIDER_SITE_OTHER): Payer: Managed Care, Other (non HMO)

## 2021-07-17 DIAGNOSIS — E1149 Type 2 diabetes mellitus with other diabetic neurological complication: Secondary | ICD-10-CM | POA: Diagnosis not present

## 2021-07-17 DIAGNOSIS — L03115 Cellulitis of right lower limb: Secondary | ICD-10-CM

## 2021-07-17 DIAGNOSIS — L97512 Non-pressure chronic ulcer of other part of right foot with fat layer exposed: Secondary | ICD-10-CM

## 2021-07-17 MED ORDER — MUPIROCIN 2 % EX OINT: 1.0000 "application " | TOPICAL_OINTMENT | Freq: Two times a day (BID) | CUTANEOUS | 2 refills | Status: DC

## 2021-07-17 MED ORDER — CEPHALEXIN 500 MG PO CAPS: 500.0000 mg | ORAL_CAPSULE | Freq: Three times a day (TID) | ORAL | 0 refills | Status: DC

## 2021-07-20 NOTE — Progress Notes (Signed)
Subjective: 51 year old male presents the office today for concerns of thick callused area, concern for wound on his right foot.  He states he first noticed this 3 days ago.  Not had any pain although he has neuropathy.  No drainage that he reports.  He has not been checking his blood sugars as he states that he ran out of his meters for his arm.  He is in the process of getting new ones.  He is diabetic and last A1c was 8.1 his last glucose was 138 on Friday he reports.  Objective: AAO x3, NAD DP/PT pulses palpable bilaterally, CRT less than 3 seconds Sensation decreased with Semmes Weinstein monofilament. Thick hyperkeratotic lesion with dried blood present right foot submetatarsal 2.  After debridement there was a small superficial granular wound measuring 0.4 x 0.2 x 0.1 cm without any probing, undermining or tunneling.  There is no drainage or pus.  No fluctuance or crepitation.  Mild rim of erythema. No pain with calf compression, swelling, warmth, erythema     Assessment: Ulceration right foot, neuropathy  Plan: -All treatment options discussed with the patient including all alternatives, risks, complications.  -X-rays were obtained reviewed.  3 views of the right foot were obtained.  No evidence of acute fracture or stress fracture.  No definitive evidence of osteomyelitis at this time. -I sharply debrided the wound today utilizing #312 with scalpel down to healthy, granular tissue.  Unable to measure the wound is completely covered with callus prior to debridement.  After debridement the wound measurements are above.  He tolerated the procedure well any complications.  No blood loss.  Clean the area with wound cleanser.  Silvadene was applied followed by dressing.  Offloading pad was also applied.  Continue daily dressing changes. -Keflex -Monitor for any clinical signs or symptoms of infection and directed to call the office immediately should any occur or go to the ER. -Patient  encouraged to call the office with any questions, concerns, change in symptoms.   Return in about 2 weeks (around 07/31/2021).  Trula Slade DPM

## 2021-07-23 ENCOUNTER — Other Ambulatory Visit: Payer: Self-pay | Admitting: Internal Medicine

## 2021-07-23 DIAGNOSIS — E1142 Type 2 diabetes mellitus with diabetic polyneuropathy: Secondary | ICD-10-CM

## 2021-07-31 ENCOUNTER — Encounter (INDEPENDENT_AMBULATORY_CARE_PROVIDER_SITE_OTHER): Payer: Managed Care, Other (non HMO) | Admitting: Ophthalmology

## 2021-07-31 DIAGNOSIS — H43813 Vitreous degeneration, bilateral: Secondary | ICD-10-CM

## 2021-07-31 DIAGNOSIS — E113313 Type 2 diabetes mellitus with moderate nonproliferative diabetic retinopathy with macular edema, bilateral: Secondary | ICD-10-CM

## 2021-07-31 DIAGNOSIS — I10 Essential (primary) hypertension: Secondary | ICD-10-CM | POA: Diagnosis not present

## 2021-07-31 DIAGNOSIS — H35033 Hypertensive retinopathy, bilateral: Secondary | ICD-10-CM | POA: Diagnosis not present

## 2021-08-01 ENCOUNTER — Ambulatory Visit (INDEPENDENT_AMBULATORY_CARE_PROVIDER_SITE_OTHER): Payer: Managed Care, Other (non HMO) | Admitting: Podiatry

## 2021-08-01 DIAGNOSIS — L97512 Non-pressure chronic ulcer of other part of right foot with fat layer exposed: Secondary | ICD-10-CM

## 2021-08-01 DIAGNOSIS — E1149 Type 2 diabetes mellitus with other diabetic neurological complication: Secondary | ICD-10-CM

## 2021-08-03 NOTE — Progress Notes (Signed)
Subjective: 51 year old male presents the office today for follow-up evaluation of wound on his right foot.  He states he is doing much better.  Appears the wound is healed.  He has not had any drainage or pus.  No swelling or redness.  No pain.  Last A1c was 8.1.  Last glucose he reports was 119 yesterday.  No fevers or chills.  No other concerns.    Objective: AAO x3, NAD DP/PT pulses palpable bilaterally, CRT less than 3 seconds Sensation decreased with Semmes Weinstein monofilament. Hyperkeratotic lesion with dried blood present right foot submetatarsal 2.  There is no underlying ulceration drainage or any signs of infection.  Appears to be healed.  There is still preulcerative which we need to monitor but no ulcerations are noted today. No pain with calf compression, swelling, warmth, erythema  Assessment: Ulceration right foot, neuropathy  Plan: -All treatment options discussed with the patient including all alternatives, risks, complications.  -Sharply debrided the hyperkeratotic tissue without any complications or bleeding.  Patient wound is healed and still preulcerative wounds on the monitor this.  Continue moisturizer to the area and continue offloading as well as daily foot inspection and glucose control.  Monitor for any reoccurrence or any signs or symptoms of infection.  No follow-ups on file.  Vivi Barrack DPM

## 2021-08-07 ENCOUNTER — Ambulatory Visit (HOSPITAL_COMMUNITY)
Admission: RE | Admit: 2021-08-07 | Discharge: 2021-08-07 | Disposition: A | Discharge status: Home / Self Care | Payer: Managed Care, Other (non HMO) | Source: Ambulatory Visit | Attending: Podiatry | Admitting: Podiatry

## 2021-08-07 DIAGNOSIS — L97512 Non-pressure chronic ulcer of other part of right foot with fat layer exposed: Secondary | ICD-10-CM | POA: Insufficient documentation

## 2021-08-13 ENCOUNTER — Telehealth: Payer: Self-pay | Admitting: Podiatry

## 2021-08-13 NOTE — Telephone Encounter (Signed)
Pt is returning a call from Monday to [employer]. He can be reached at [phone] in the next two hours, he just got off work 3rd shift. After 12pm, please call [phone] and speak with his mother, Jason Monroe, who takes his calls and has his authorization to speak to anyone regarding his medical.  Thank you

## 2021-08-18 ENCOUNTER — Other Ambulatory Visit: Payer: Self-pay | Admitting: Internal Medicine

## 2021-08-18 DIAGNOSIS — E1142 Type 2 diabetes mellitus with diabetic polyneuropathy: Secondary | ICD-10-CM

## 2021-08-27 ENCOUNTER — Ambulatory Visit (INDEPENDENT_AMBULATORY_CARE_PROVIDER_SITE_OTHER): Payer: Managed Care, Other (non HMO) | Admitting: Student

## 2021-08-27 VITALS — BP 145/91 | HR 77 | Wt 270.0 lb

## 2021-08-27 DIAGNOSIS — F321 Major depressive disorder, single episode, moderate: Secondary | ICD-10-CM

## 2021-08-27 DIAGNOSIS — E1140 Type 2 diabetes mellitus with diabetic neuropathy, unspecified: Secondary | ICD-10-CM | POA: Diagnosis not present

## 2021-08-27 DIAGNOSIS — Z794 Long term (current) use of insulin: Secondary | ICD-10-CM

## 2021-08-27 LAB — POCT GLYCOSYLATED HEMOGLOBIN (HGB A1C): Hemoglobin A1C: 12.2 % — AB (ref 4.0–5.6)

## 2021-08-27 LAB — GLUCOSE, CAPILLARY: Glucose-Capillary: 263 mg/dL — ABNORMAL HIGH (ref 70–99)

## 2021-08-27 MED ORDER — XULTOPHY 100-3.6 UNIT-MG/ML ~~LOC~~ SOPN: 30.0000 [IU] | PEN_INJECTOR | Freq: Every day | SUBCUTANEOUS | 2 refills | Status: DC

## 2021-08-27 MED ORDER — SERTRALINE HCL 25 MG PO TABS: 25.0000 mg | ORAL_TABLET | Freq: Every day | ORAL | 2 refills | Status: DC

## 2021-08-27 NOTE — Patient Instructions (Signed)
Jason Monroe,  It was a pleasure seeing you in the clinic today.   I have prescribed zoloft to help with your symptoms. Please take this once a day. I have placed a referral to Psychiatry to help with counseling. Please stop taking cymbalta at home. Please make sure to take your xultophy daily to better control your diabetes. Come back in 1 month so that we can make sure things are getting better.  Please call our clinic at [phone] if you have any questions or concerns. The best time to call is Monday-Friday from 9am-4pm, but there is someone available 24/7 at the same number. If you need medication refills, please notify your pharmacy one week in advance and they will send [place] a request.   Thank you for letting us take part in your care. We look forward to seeing you next time!

## 2021-08-27 NOTE — Progress Notes (Signed)
   CC: f/u T2DM, evaluation for depression  HPI:  Mr.Authur H Monroe is a 51 y.o. male with history listed below presenting to the [facility] for f/u T2DM, evaluation for depression. Please see individualized problem based charting for full HPI.  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  Physical Exam:  Vitals:   08/27/21 1419  BP: (!) 145/91  Pulse: 77  SpO2: 98%  Weight: 270 lb (122.5 kg)   Physical Exam Constitutional:      Appearance: Normal appearance. He is obese. He is not ill-appearing.  HENT:     Mouth/Throat:     Mouth: Mucous membranes are moist.     Pharynx: Oropharynx is clear.  Eyes:     Extraocular Movements: Extraocular movements intact.     Conjunctiva/sclera: Conjunctivae normal.     Pupils: Pupils are equal, round, and reactive to light.  Cardiovascular:     Rate and Rhythm: Normal rate and regular rhythm.     Pulses: Normal pulses.     Heart sounds: Normal heart sounds. No murmur heard.    No gallop.  Pulmonary:     Effort: Pulmonary effort is normal.     Breath sounds: Normal breath sounds. No wheezing or rales.  Abdominal:     General: Bowel sounds are normal. There is no distension.     Palpations: Abdomen is soft.     Tenderness: There is no abdominal tenderness.  Musculoskeletal:        General: No swelling. Normal range of motion.  Skin:    General: Skin is warm and dry.  Neurological:     General: No focal deficit present.     Mental Status: He is alert and oriented to person, place, and time.  Psychiatric:        Behavior: Behavior normal.        Thought Content: Thought content normal.        Judgment: Judgment normal.     Comments: Depressed mood      Assessment & Plan:   See Encounters Tab for problem based charting.  Patient  discussed with Dr. Mikey Bussing

## 2021-08-28 ENCOUNTER — Encounter (INDEPENDENT_AMBULATORY_CARE_PROVIDER_SITE_OTHER): Payer: Managed Care, Other (non HMO) | Admitting: Ophthalmology

## 2021-08-28 ENCOUNTER — Encounter: Payer: Managed Care, Other (non HMO) | Admitting: Student

## 2021-08-28 DIAGNOSIS — E113313 Type 2 diabetes mellitus with moderate nonproliferative diabetic retinopathy with macular edema, bilateral: Secondary | ICD-10-CM | POA: Diagnosis not present

## 2021-08-28 DIAGNOSIS — I10 Essential (primary) hypertension: Secondary | ICD-10-CM

## 2021-08-28 DIAGNOSIS — F32A Depression, unspecified: Secondary | ICD-10-CM | POA: Insufficient documentation

## 2021-08-28 DIAGNOSIS — H35033 Hypertensive retinopathy, bilateral: Secondary | ICD-10-CM

## 2021-08-28 DIAGNOSIS — H35372 Puckering of macula, left eye: Secondary | ICD-10-CM

## 2021-08-28 DIAGNOSIS — H43813 Vitreous degeneration, bilateral: Secondary | ICD-10-CM

## 2021-08-28 NOTE — Assessment & Plan Note (Signed)
Patient living with T2DM, currently on xultophy 30u daily. He developed a diffuse rash with initiation of combination of metformin and glipizide in the past. He reports not taking his diabetic medication regularly due to personal stressors and recent onset of depression (see separate problem). Last A1c of 8.1%, today A1c of 12.2%. We discussed the importance of tighter glycemic control and he notes understanding. He notes doing well on xultophy when he takes it regularly and appears to be committed to adhering to therapy. Will avoid making medication changes at this time given irregular use.   Plan: -refilled xultophy 30u daily -f/u in 1 month to ensure adherence -next A1c check in 3 months, may need to consider additional therapy if remains uncontrolled

## 2021-08-28 NOTE — Assessment & Plan Note (Signed)
Patient reports depressed mood and anhedonia for the past 6-8 months. He also endorses increased fatigue, sleeping more, weight gain, and trouble concentrating both at work and at home. He endorses rare thoughts of what the point of living is, but he denies any suicidal ideation or homicidal ideation and has never had a plan to act on his thoughts. He reports that he has never thought too much as to why he is having these thoughts, but his change in behavior has been pointed out to him by family and friends. He states that he used to be the life of party and was always optimistic about things in life, but as of late he no longer enjoys going to events, spending time with friends and family, or going to work. He is always tired and sleepy. He has been eating more often even when not necessarily feeling hungry.   We discussed that his symptoms are consistent with underlying depression and he agrees with this. His PHQ-9 is 17 today, suggestive of moderate depression. He has a good support system at home with a twin brother and his mother who both live together with him. He wants his life to go back to how it was previously and is amenable to starting antidepressant therapy. He was previously on cymbalta for neuropathic pain but states that he has not taken this in some time now. We discussed starting zoloft for management of his depressive symptoms. I have also placed a referral to psychiatry for outpatient counseling and medication management. He does have resources at work for counseling as well which he will use to bridge the gap until he is seen by psychiatry.  Plan: -stop cymbalta -start zoloft 25mg  daily, may need to up-titrate this dose at next visit  -referral to psychiatry -f/u in 1 month to assess if depressive symptoms are better controlled

## 2021-09-03 NOTE — Progress Notes (Signed)
Internal Medicine Clinic Attending  Case discussed with the resident at the time of the visit.  We reviewed the resident's history and exam and pertinent patient test results.  I agree with the assessment, diagnosis, and plan of care documented in the resident's note.  

## 2021-09-22 ENCOUNTER — Ambulatory Visit (INDEPENDENT_AMBULATORY_CARE_PROVIDER_SITE_OTHER): Payer: Managed Care, Other (non HMO) | Admitting: Podiatry

## 2021-09-22 DIAGNOSIS — E119 Type 2 diabetes mellitus without complications: Secondary | ICD-10-CM

## 2021-09-22 DIAGNOSIS — E1149 Type 2 diabetes mellitus with other diabetic neurological complication: Secondary | ICD-10-CM | POA: Diagnosis not present

## 2021-09-22 DIAGNOSIS — L84 Corns and callosities: Secondary | ICD-10-CM | POA: Diagnosis not present

## 2021-09-22 NOTE — Patient Instructions (Signed)
Diabetes Mellitus and Foot Care Foot care is an important part of your health, especially when you have diabetes. Diabetes may cause you to have problems because of poor blood flow (circulation) to your feet and legs, which can cause your skin to: Become thinner and drier. Break more easily. Heal more slowly. Peel and crack. You may also have nerve damage (neuropathy) in your legs and feet, causing decreased feeling in them. This means that you may not notice minor injuries to your feet that could lead to more serious problems. Noticing and addressing any potential problems early is the best way to prevent future foot problems. How to care for your feet Foot hygiene  Wash your feet daily with warm water and mild soap. Do not use hot water. Then, pat your feet and the areas between your toes until they are completely dry. Do not soak your feet as this can dry your skin. Trim your toenails straight across. Do not dig under them or around the cuticle. File the edges of your nails with an emery board or nail file. Apply a moisturizing lotion or petroleum jelly to the skin on your feet and to dry, brittle toenails. Use lotion that does not contain alcohol and is unscented. Do not apply lotion between your toes. Shoes and socks Wear clean socks or stockings every day. Make sure they are not too tight. Do not wear knee-high stockings since they may decrease blood flow to your legs. Wear shoes that fit properly and have enough cushioning. Always look in your shoes before you put them on to be sure there are no objects inside. To break in new shoes, wear them for just a few hours a day. This prevents injuries on your feet. Wounds, scrapes, corns, and calluses  Check your feet daily for blisters, cuts, bruises, sores, and redness. If you cannot see the bottom of your feet, use a mirror or ask someone for help. Do not cut corns or calluses or try to remove them with medicine. If you find a minor scrape,  cut, or break in the skin on your feet, keep it and the skin around it clean and dry. You may clean these areas with mild soap and water. Do not clean the area with peroxide, alcohol, or iodine. If you have a wound, scrape, corn, or callus on your foot, look at it several times a day to make sure it is healing and not infected. Check for: Redness, swelling, or pain. Fluid or blood. Warmth. Pus or a bad smell. General tips Do not cross your legs. This may decrease blood flow to your feet. Do not use heating pads or hot water bottles on your feet. They may burn your skin. If you have lost feeling in your feet or legs, you may not know this is happening until it is too late. Protect your feet from hot and cold by wearing shoes, such as at the beach or on hot pavement. Schedule a complete foot exam at least once a year (annually) or more often if you have foot problems. Report any cuts, sores, or bruises to your health care provider immediately. Where to find more information American Diabetes Association: www.diabetes.org Association of Diabetes Care & Education Specialists: www.diabeteseducator.org Contact a health care provider if: You have a medical condition that increases your risk of infection and you have any cuts, sores, or bruises on your feet. You have an injury that is not healing. You have redness on your legs or feet. You   feel burning or tingling in your legs or feet. You have pain or cramps in your legs and feet. Your legs or feet are numb. Your feet always feel cold. You have pain around any toenails. Get help right away if: You have a wound, scrape, corn, or callus on your foot and: You have pain, swelling, or redness that gets worse. You have fluid or blood coming from the wound, scrape, corn, or callus. Your wound, scrape, corn, or callus feels warm to the touch. You have pus or a bad smell coming from the wound, scrape, corn, or callus. You have a fever. You have a red  line going up your leg. Summary Check your feet every day for blisters, cuts, bruises, sores, and redness. Apply a moisturizing lotion or petroleum jelly to the skin on your feet and to dry, brittle toenails. Wear shoes that fit properly and have enough cushioning. If you have foot problems, report any cuts, sores, or bruises to your health care provider immediately. Schedule a complete foot exam at least once a year (annually) or more often if you have foot problems. This information is not intended to replace advice given to you by your health care provider. Make sure you discuss any questions you have with your health care provider. Document Revised: 09/07/2019 Document Reviewed: 09/07/2019 Elsevier Patient Education  2023 [employer]  

## 2021-09-22 NOTE — Progress Notes (Signed)
Subjective: Chief Complaint  Patient presents with   Diabetes    Diabetic foot care, A1C- 10, BG- 136 Callus Right foot, pt is doing well, No pain   Presents for above complaint.  He states he is doing well he does not see any open lesions or any swelling or redness or any drainage.  He has a callus on the right foot he does trim his nails himself.  A1c increased.  Since that he was having some depression and he was not given a bed and eating more.  This is improved with new medication.   Objective: AAO x3, NAD DP/PT pulses palpable bilaterally, CRT less than 3 seconds Sensation decreased. Hyperkeratotic lesion plantar aspect right foot submetatarsal area along the area of prior scar.  Upon debridement there is no underlying ulceration drainage or signs of infection.  Also callus plantar hallux without any ulcerations or signs of infection. No pain with calf compression, swelling, warmth, erythema  Assessment: Hyperkeratotic lesion right foot which is preulcerative  Plan: -All treatment options discussed with the patient including all alternatives, risks, complications.  -Sharply debrided lesion without any complications or bleeding x2.  Recommendoffloading. -Glucose control  -Daily foot inspection  -Patient encouraged to call the office with any questions, concerns, change in symptoms.   Vivi Barrack DPM

## 2021-09-25 ENCOUNTER — Encounter (INDEPENDENT_AMBULATORY_CARE_PROVIDER_SITE_OTHER): Payer: Managed Care, Other (non HMO) | Admitting: Ophthalmology

## 2021-09-25 DIAGNOSIS — H35033 Hypertensive retinopathy, bilateral: Secondary | ICD-10-CM

## 2021-09-25 DIAGNOSIS — E113313 Type 2 diabetes mellitus with moderate nonproliferative diabetic retinopathy with macular edema, bilateral: Secondary | ICD-10-CM

## 2021-09-25 DIAGNOSIS — I10 Essential (primary) hypertension: Secondary | ICD-10-CM

## 2021-09-25 DIAGNOSIS — H43813 Vitreous degeneration, bilateral: Secondary | ICD-10-CM

## 2021-09-25 LAB — HM DIABETES EYE EXAM

## 2021-09-29 ENCOUNTER — Encounter: Payer: Self-pay | Admitting: Internal Medicine

## 2021-09-29 ENCOUNTER — Encounter: Payer: Self-pay | Admitting: Dietician

## 2021-09-29 ENCOUNTER — Ambulatory Visit (INDEPENDENT_AMBULATORY_CARE_PROVIDER_SITE_OTHER): Payer: Managed Care, Other (non HMO) | Admitting: Internal Medicine

## 2021-09-29 DIAGNOSIS — F321 Major depressive disorder, single episode, moderate: Secondary | ICD-10-CM

## 2021-09-29 MED ORDER — SERTRALINE HCL 50 MG PO TABS: 50.0000 mg | ORAL_TABLET | Freq: Every day | ORAL | 2 refills | Status: DC

## 2021-09-29 NOTE — Patient Instructions (Signed)
Mr. Ursua,  It was a pleasure to care for you today. I am so glad to hear that you have started to notice improvements in how you are feeling since your last appointment. I know some days may be tougher than others but I encourage you to persist as you have been!  I have sent in a new prescription for Zoloft 50 mg daily and I will follow-up with you at the end of this month to see how you are doing with this new dose.  Please do not hesitate to contact our office with any concerns before then.  My best, Dr. August Saucer

## 2021-09-29 NOTE — Assessment & Plan Note (Signed)
PHQ-9 of 10 today which is improved from 17 one month ago. He has been taking Zoloft 25 mg daily to this point and though he does not feel significant changes, he does think it is helping some. 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 He has received a lot of support from friends and prior employees to do this. He has cut back his hours at [employer] which has also helped. He is able to get out of bed in the morning and find motivation to keep doing the next necessary task each day, which was not something he was previously able to do. He has started taking his diabetes medication as prescribed again as well. He has more interest in doing things now and has a better appetite. Assessment:Patient's depressive symptoms are improved though still present. He is on a low dose of Zoloft at this time. Plan:Increase Zoloft to 50 mg daily. RTC in 1 month for follow-up.

## 2021-09-29 NOTE — Progress Notes (Signed)
   CC: 1 month follow-up  HPI:  Jason Monroe is a 51 y.o. male with past medical history of hypertension, diabetes mellitus with neuropathy who presents for a 1 month follow-up after being started on new medication for depression.  Please see problem charting for detail assessment and plan.  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  Physical Exam:  Vitals:   09/29/21 1455  BP: 138/87  Pulse: 76  Temp: 97.8 F (36.6 C)  TempSrc: Oral  SpO2: 96%  Weight: 276 lb 3.2 oz (125.3 kg)  Height: 6' (1.829 m)   Constitutional:No acute distress. Cardio:Regular rate and rhythm. No murmurs, rubs, or gallops. Pulm:Clear to auscultation bilaterally. KCL:EXNTZGYF for extremity edema. Skin:Skin is warm and dry. Neuro:Alert and oriented x3. No focal deficit noted. Psych:Pleasant mood and affect. Seems hopeful and cautiously optimistic.  Assessment & Plan:   See Encounters Tab for problem based charting.  Depression PHQ-9 of 10 today which is improved from 17 one month ago. He has been taking Zoloft 25 mg daily to this point and though he does not feel significant changes, he does think it is helping some. 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 He has received a lot of support from friends and prior employees to do this. He has cut back his hours at [employer] which has also helped. He is able to get out of bed in the morning and find motivation to keep doing the next necessary task each day, which was not something he was previously able to do. He has started taking his diabetes medication as prescribed again as well. He has more interest in doing things now and has a better  appetite. Assessment:Patient's depressive symptoms are improved though still present. He is on a low dose of Zoloft at this time. Plan:Increase Zoloft to 50 mg daily. RTC in 1 month for follow-up.    Patient seen with Dr. Antony Contras

## 2021-09-30 NOTE — Progress Notes (Signed)
Internal Medicine Clinic Attending ? ?Case discussed with Dr. Dean  At the time of the visit.  We reviewed the resident?s history and exam and pertinent patient test results.  I agree with the assessment, diagnosis, and plan of care documented in the resident?s note.  ?

## 2021-10-22 ENCOUNTER — Ambulatory Visit (INDEPENDENT_AMBULATORY_CARE_PROVIDER_SITE_OTHER): Payer: Managed Care, Other (non HMO) | Admitting: Internal Medicine

## 2021-10-22 ENCOUNTER — Encounter: Payer: Self-pay | Admitting: Internal Medicine

## 2021-10-22 VITALS — BP 140/82 | HR 68 | Ht 72.0 in | Wt 276.4 lb

## 2021-10-22 DIAGNOSIS — F321 Major depressive disorder, single episode, moderate: Secondary | ICD-10-CM | POA: Diagnosis not present

## 2021-10-22 DIAGNOSIS — Z1211 Encounter for screening for malignant neoplasm of colon: Secondary | ICD-10-CM | POA: Insufficient documentation

## 2021-10-22 DIAGNOSIS — Z0000 Encounter for general adult medical examination without abnormal findings: Secondary | ICD-10-CM

## 2021-10-22 NOTE — Progress Notes (Signed)
   CC: depression follow-up  HPI:  [name] is a 51 y.o. person with past medical history as detailed below who presents for 1 month follow-up for his depression after increasing his medication 1 month ago. Please see problem based charting for detailed assessment and plan.  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  Physical Exam:  Vitals:   10/22/21 1101 10/22/21 1130  BP: (!) 153/81 (!) 140/82  Pulse: 68   SpO2: 99%   Weight: 276 lb 6.4 oz (125.4 kg)   Height: 6' (1.829 m)    Constitutional:Well-appearing gentleman seated comfortably in exam room chair. In no acute distress. Cardio:Regular rate and rhythm. No murmurs, rubs, or gallops. Pulm:Clear to auscultation bilaterally. Normal work of breathing on room air. SJG:GEZMOQHU for extremity edema. Skin:Warm and dry. Neuro:Alert and oriented x3. No focal deficit noted. Psych:Pleasant mood and affect.   Assessment & Plan:   See Encounters Tab for problem based charting.  Depression Patient presents for 4 week follow-up of depression after increasing his dose of sertraline to 50 mg daily. He states that he feels drastically improved over this time and that he is now able to get out of bed each day with more ease, is eating again, and spending more time with his family. PHQ-9 is 1 today, from 10 last OV. Plan:Continue sertraline 50 mg daily. Will assess how he is feeling at general follow-up in one month.  Encounter for screening colonoscopy for non-high-risk patient Patient due to begin screening colonoscopy. Plan:Referral to GI placed.  Healthcare maintenance Patient is in agreement to complete hepatitis C screening and Tdap booster at next OV.  Patient discussed with Dr. Oswaldo Done

## 2021-10-22 NOTE — Patient Instructions (Signed)
Mr. Gorter,  It was nice seeing you today! Thank you for choosing [facility] for your Primary Care.    Today we talked about:   Depression: I am glad to hear that you are feeling more and more like yourself! Continue taking your Zoloft. We will see you in 1 month for follow up of your overall health and make sure you are still doing well on this dose at that time.  If you need [place] before then don't hesitate to reach out.  My best, Dr. August Saucer

## 2021-10-22 NOTE — Assessment & Plan Note (Signed)
Patient due to begin screening colonoscopy. Plan:Referral to GI placed.

## 2021-10-22 NOTE — Assessment & Plan Note (Signed)
Patient is in agreement to complete hepatitis C screening and Tdap booster at next OV.

## 2021-10-22 NOTE — Assessment & Plan Note (Signed)
Patient presents for 4 week follow-up of depression after increasing his dose of sertraline to 50 mg daily. He states that he feels drastically improved over this time and that he is now able to get out of bed each day with more ease, is eating again, and spending more time with his family. PHQ-9 is 1 today, from 10 last OV. Plan:Continue sertraline 50 mg daily. Will assess how he is feeling at general follow-up in one month.

## 2021-10-23 ENCOUNTER — Encounter (INDEPENDENT_AMBULATORY_CARE_PROVIDER_SITE_OTHER): Payer: Managed Care, Other (non HMO) | Admitting: Ophthalmology

## 2021-10-23 DIAGNOSIS — E113313 Type 2 diabetes mellitus with moderate nonproliferative diabetic retinopathy with macular edema, bilateral: Secondary | ICD-10-CM | POA: Diagnosis not present

## 2021-10-23 DIAGNOSIS — H35033 Hypertensive retinopathy, bilateral: Secondary | ICD-10-CM | POA: Diagnosis not present

## 2021-10-23 DIAGNOSIS — I10 Essential (primary) hypertension: Secondary | ICD-10-CM

## 2021-10-23 DIAGNOSIS — H35372 Puckering of macula, left eye: Secondary | ICD-10-CM | POA: Diagnosis not present

## 2021-10-23 DIAGNOSIS — H43813 Vitreous degeneration, bilateral: Secondary | ICD-10-CM

## 2021-10-23 NOTE — Progress Notes (Signed)
Internal Medicine Clinic Attending ? ?Case discussed with Dr. Dean  At the time of the visit.  We reviewed the resident?s history and exam and pertinent patient test results.  I agree with the assessment, diagnosis, and plan of care documented in the resident?s note.  ?

## 2021-10-29 NOTE — Telephone Encounter (Signed)
error 

## 2021-11-17 ENCOUNTER — Other Ambulatory Visit: Payer: Self-pay

## 2021-11-17 NOTE — Telephone Encounter (Signed)
REFILL REQUEST ON lisinopril-hydrochlorothiazide (ZESTORETIC) 20-12.5 MG tablet.

## 2021-11-19 MED ORDER — LISINOPRIL-HYDROCHLOROTHIAZIDE 20-12.5 MG PO TABS
2.0000 | ORAL_TABLET | Freq: Every day | ORAL | 5 refills | Status: DC
Start: 2021-11-19 — End: 2021-12-01

## 2021-11-20 ENCOUNTER — Encounter (INDEPENDENT_AMBULATORY_CARE_PROVIDER_SITE_OTHER): Payer: Managed Care, Other (non HMO) | Admitting: Ophthalmology

## 2021-11-20 DIAGNOSIS — H43813 Vitreous degeneration, bilateral: Secondary | ICD-10-CM | POA: Diagnosis not present

## 2021-11-20 DIAGNOSIS — I10 Essential (primary) hypertension: Secondary | ICD-10-CM

## 2021-11-20 DIAGNOSIS — H35033 Hypertensive retinopathy, bilateral: Secondary | ICD-10-CM

## 2021-11-20 DIAGNOSIS — E113313 Type 2 diabetes mellitus with moderate nonproliferative diabetic retinopathy with macular edema, bilateral: Secondary | ICD-10-CM

## 2021-12-01 ENCOUNTER — Ambulatory Visit: Payer: Managed Care, Other (non HMO) | Admitting: Internal Medicine

## 2021-12-01 VITALS — BP 149/89 | HR 63 | Temp 97.7°F | Wt 274.7 lb

## 2021-12-01 DIAGNOSIS — Z794 Long term (current) use of insulin: Secondary | ICD-10-CM

## 2021-12-01 DIAGNOSIS — I10 Essential (primary) hypertension: Secondary | ICD-10-CM

## 2021-12-01 DIAGNOSIS — E1140 Type 2 diabetes mellitus with diabetic neuropathy, unspecified: Secondary | ICD-10-CM

## 2021-12-01 DIAGNOSIS — F321 Major depressive disorder, single episode, moderate: Secondary | ICD-10-CM

## 2021-12-01 DIAGNOSIS — Z1159 Encounter for screening for other viral diseases: Secondary | ICD-10-CM

## 2021-12-01 DIAGNOSIS — Z23 Encounter for immunization: Secondary | ICD-10-CM | POA: Diagnosis not present

## 2021-12-01 DIAGNOSIS — Z87891 Personal history of nicotine dependence: Secondary | ICD-10-CM

## 2021-12-01 LAB — GLUCOSE, CAPILLARY: Glucose-Capillary: 121 mg/dL — ABNORMAL HIGH (ref 70–99)

## 2021-12-01 LAB — POCT GLYCOSYLATED HEMOGLOBIN (HGB A1C): Hemoglobin A1C: 9.7 % — AB (ref 4.0–5.6)

## 2021-12-01 MED ORDER — DEXCOM G7 SENSOR MISC: 1.0000 | Freq: Once | 11 refills | Status: AC

## 2021-12-01 MED ORDER — ROSUVASTATIN CALCIUM 10 MG PO TABS: 10.0000 mg | ORAL_TABLET | Freq: Every day | ORAL | 11 refills | Status: DC

## 2021-12-01 MED ORDER — LISINOPRIL-HYDROCHLOROTHIAZIDE 20-12.5 MG PO TABS: 2.0000 | ORAL_TABLET | Freq: Every day | ORAL | 5 refills | Status: DC

## 2021-12-01 MED ORDER — DEXCOM G6 TRANSMITTER MISC: 1.0000 | Freq: Once | 3 refills | Status: AC

## 2021-12-01 MED ORDER — XULTOPHY 100-3.6 UNIT-MG/ML ~~LOC~~ SOPN: 30.0000 [IU] | PEN_INJECTOR | Freq: Every day | SUBCUTANEOUS | 2 refills | Status: DC

## 2021-12-01 MED ORDER — METFORMIN HCL 500 MG PO TABS: 500.0000 mg | ORAL_TABLET | Freq: Every day | ORAL | 11 refills | Status: DC

## 2021-12-01 MED ORDER — DEXCOM G7 RECEIVER DEVI: 1.0000 | Freq: Once | 2 refills | Status: AC

## 2021-12-01 MED ORDER — SERTRALINE HCL 50 MG PO TABS: 50.0000 mg | ORAL_TABLET | Freq: Every day | ORAL | 2 refills | Status: DC

## 2021-12-01 NOTE — Progress Notes (Signed)
   CC: routine f/u  HPI:  Jason Monroe is a 51 y.o. person with past medical history as detailed below who presents today for follow-up of diabetes and depression. Please see problem based charting for detailed assessment and plan.   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  Physical Exam:  Vitals:   12/01/21 1312 12/01/21 1343  BP: (!) 144/89 (!) 149/89  Pulse: 66 63  Temp: 97.7 F (36.5 C)   TempSrc: Oral   SpO2: 100%   Weight: 274 lb 11.2 oz (124.6 kg)    Constitutional:Well appearing, in no acute distress. Cardio:Regular rate and rhythm. No murmurs, rubs, or gallops. Pulm:Clear to auscultation bilaterally. Normal work of breathing on room air. TDV:VOHYWVPX for extremity edema. Skin:Warm and dry. Neuro:Alert and oriented x3. No focal deficit noted. Psych:Pleasant mood and affect.   Assessment & Plan:   See Encounters Tab for problem based charting.  Essential hypertension BP nearly at goal, 144/89. Has previously been at goal. Current management is lisinopril-HCTZ 20-12.5 mg daily. Plan:Continue lisinopril-HCTZ 20-12.5 mg daily.  Diabetes mellitus with diabetic neuropathy, with long-term current use of insulin (HCC) HbA1c improved to 9.7% today from 12.2% in June. Patient was struggling with a major depressive episode around that time and was not compliant with medications as a result. Over the last several months he has been on increasing doses of Zoloft and is now feeling the benefit of the antidepressant and is complaint with medication, Xultophy, again. Assessment:Patient would benefit from additional glycemic control. I am very proud of the progress he has made with his mental health and diabetes control. He has had concerns regarding intolerance to metformin/glipizide/HCTZ in the past as he  developed a rash however it is unclear which of these agents was the culprit. Plan:Start metformin 500 mg daily at bedtime. Continue Xultophy 30 units daily. Will also send in a Dexcom for patient for better monitoring of home glucose levels. Check BMP, urine protein studies as well.  Depression Patient is doing well on Zoloft 50 mg daily. He is getting out of bed each day with more energy, has an appetite again, and is meeting personal goals. He is completing projects and feels like himself again. He is happy with his current dose.  Plan:Continue Zoloft 50 mg daily.  Encounter for hepatitis C screening test for low risk patient Due for hepatitis C screening. Plan:Hepatitis C screening obtained.  Need for immunization against influenza Flu vaccine given today.  Need for Tdap vaccination Due for tetanus booster. Plan:Tetanus booster given.  Patient discussed with Dr. Jimmye Norman

## 2021-12-01 NOTE — Assessment & Plan Note (Addendum)
HbA1c improved to 9.7% today from 12.2% in June. Patient was struggling with a major depressive episode around that time and was not compliant with medications as a result. Over the last several months he has been on increasing doses of Zoloft and is now feeling the benefit of the antidepressant and is complaint with medication, Xultophy, again. Assessment:Patient would benefit from additional glycemic control. I am very proud of the progress he has made with his mental health and diabetes control. He has had concerns regarding intolerance to metformin/glipizide/HCTZ in the past as he developed a rash however it is unclear which of these agents was the culprit. Plan:Start metformin 500 mg daily at bedtime. Continue Xultophy 30 units daily. Will also send in a Dexcom for patient for better monitoring of home glucose levels. Check BMP, urine protein studies as well.

## 2021-12-01 NOTE — Assessment & Plan Note (Signed)
Patient is doing well on Zoloft 50 mg daily. He is getting out of bed each day with more energy, has an appetite again, and is meeting personal goals. He is completing projects and feels like himself again. He is happy with his current dose.  Plan:Continue Zoloft 50 mg daily.

## 2021-12-01 NOTE — Assessment & Plan Note (Signed)
BP nearly at goal, 144/89. Has previously been at goal. Current management is lisinopril-HCTZ 20-12.5 mg daily. Plan:Continue lisinopril-HCTZ 20-12.5 mg daily.

## 2021-12-01 NOTE — Assessment & Plan Note (Signed)
Due for tetanus booster. Plan:Tetanus booster given.

## 2021-12-01 NOTE — Addendum Note (Signed)
Addended by: Renato Battles on: 12/01/2021 02:45 PM   Modules accepted: Orders

## 2021-12-01 NOTE — Assessment & Plan Note (Signed)
Due for hepatitis C screening. Plan:Hepatitis C screening obtained.

## 2021-12-01 NOTE — Assessment & Plan Note (Signed)
Flu vaccine given today. 

## 2021-12-02 ENCOUNTER — Telehealth: Payer: Self-pay

## 2021-12-02 LAB — MICROALBUMIN / CREATININE URINE RATIO
Creatinine, Urine: 126 mg/dL
Microalb/Creat Ratio: 50 mg/g creat — ABNORMAL HIGH (ref 0–29)
Microalbumin, Urine: 62.9 ug/mL

## 2021-12-02 NOTE — Telephone Encounter (Signed)
DECISION :      Out come  Approved today   Request Reference Number: [id number]. DEXCOM G6 MIS RECEIVER is approved through 12/03/2022.    Your patient may now fill this prescription and it will be covered.   Drug Dexcom G6 Receiver device   Form [facility] Electronic Prior Authorization Form [phone] NCPDP)     ( COPY SENT TO PHARMACY )

## 2021-12-02 NOTE — Telephone Encounter (Signed)
INSURANCE INFO :   ID # [id number]  RX BIN : [id number]  RX GROUP : [id number]    RX PCN : [id number]

## 2021-12-02 NOTE — Telephone Encounter (Signed)
Pa for pt ( DEXCOM G 6 RECEIVER ) came through on cover my meds was submitted with last office note and labs .Marland Kitchen Awaiting approval or denial ..     UPDATE :    [facility] is reviewing your PA request. Typically an electronic response will be received within 24-72 hours. To check for an update later, open this request from your dashboard.

## 2021-12-03 LAB — BMP8+ANION GAP
Anion Gap: 14 mmol/L (ref 10.0–18.0)
BUN/Creatinine Ratio: 19 (ref 9–20)
BUN: 16 mg/dL (ref 6–24)
CO2: 27 mmol/L (ref 20–29)
Calcium: 9.7 mg/dL (ref 8.7–10.2)
Chloride: 100 mmol/L (ref 96–106)
Creatinine, Ser: 0.83 mg/dL (ref 0.76–1.27)
Glucose: 113 mg/dL — ABNORMAL HIGH (ref 70–99)
Potassium: 4 mmol/L (ref 3.5–5.2)
Sodium: 141 mmol/L (ref 134–144)
eGFR: 107 mL/min/{1.73_m2} (ref >59)

## 2021-12-03 LAB — HCV AB W REFLEX TO QUANT PCR: HCV Ab: NONREACTIVE

## 2021-12-03 LAB — HCV INTERPRETATION

## 2021-12-04 ENCOUNTER — Telehealth: Payer: Self-pay | Admitting: Internal Medicine

## 2021-12-04 NOTE — Telephone Encounter (Signed)
Attempted to contact patient to give lab results from recent [place] but there was no answer. Left VM for him to return my call at his convenience.  Farrel Gordon, DO

## 2021-12-05 ENCOUNTER — Encounter: Payer: Self-pay | Admitting: Internal Medicine

## 2021-12-05 NOTE — Telephone Encounter (Signed)
Error

## 2021-12-08 NOTE — Progress Notes (Signed)
Internal Medicine Clinic Attending ? ?Case discussed with Dr. Dean  At the time of the visit.  We reviewed the resident?s history and exam and pertinent patient test results.  I agree with the assessment, diagnosis, and plan of care documented in the resident?s note.  ?

## 2021-12-18 ENCOUNTER — Encounter (INDEPENDENT_AMBULATORY_CARE_PROVIDER_SITE_OTHER): Payer: Managed Care, Other (non HMO) | Admitting: Ophthalmology

## 2021-12-18 DIAGNOSIS — E113313 Type 2 diabetes mellitus with moderate nonproliferative diabetic retinopathy with macular edema, bilateral: Secondary | ICD-10-CM

## 2021-12-18 DIAGNOSIS — H35372 Puckering of macula, left eye: Secondary | ICD-10-CM | POA: Diagnosis not present

## 2021-12-18 DIAGNOSIS — H35033 Hypertensive retinopathy, bilateral: Secondary | ICD-10-CM | POA: Diagnosis not present

## 2021-12-18 DIAGNOSIS — H43813 Vitreous degeneration, bilateral: Secondary | ICD-10-CM

## 2021-12-18 DIAGNOSIS — I10 Essential (primary) hypertension: Secondary | ICD-10-CM

## 2021-12-23 ENCOUNTER — Telehealth: Payer: Self-pay

## 2021-12-23 NOTE — Telephone Encounter (Signed)
Incoming fax from pharmacy for a Prior Authorization for [employer]. Trulicity discontinued [phone] - made pharmacy aware via fax

## 2022-01-09 ENCOUNTER — Telehealth: Payer: Self-pay | Admitting: *Deleted

## 2022-01-09 ENCOUNTER — Telehealth: Payer: Self-pay

## 2022-01-09 NOTE — Telephone Encounter (Signed)
Requesting to speak with a nurse about insulin, please call pt back.  

## 2022-01-09 NOTE — Telephone Encounter (Signed)
Call from patient.  His Xultophy has increased in price with [employer].  Wants to know if he can increase the Metformin to 1000 mg and start on Jardiance 10 mg like his brother.   Ok to call and leave message with his mother.

## 2022-01-12 ENCOUNTER — Ambulatory Visit (INDEPENDENT_AMBULATORY_CARE_PROVIDER_SITE_OTHER): Payer: Managed Care, Other (non HMO) | Admitting: Internal Medicine

## 2022-01-12 DIAGNOSIS — E1140 Type 2 diabetes mellitus with diabetic neuropathy, unspecified: Secondary | ICD-10-CM | POA: Diagnosis not present

## 2022-01-12 DIAGNOSIS — Z794 Long term (current) use of insulin: Secondary | ICD-10-CM | POA: Diagnosis not present

## 2022-01-12 MED ORDER — INSULIN GLARGINE 100 UNIT/ML SOLOSTAR PEN
10.0000 [IU] | PEN_INJECTOR | Freq: Two times a day (BID) | SUBCUTANEOUS | 11 refills | Status: DC
  Filled 2022-01-12: qty 6, 30d supply, fill #0

## 2022-01-12 MED ORDER — LIRAGLUTIDE 18 MG/3ML ~~LOC~~ SOPN: 1.8000 mg | PEN_INJECTOR | Freq: Every day | SUBCUTANEOUS | 0 refills | Status: DC

## 2022-01-12 MED ORDER — INSULIN GLARGINE 100 UNIT/ML SOLOSTAR PEN: 10.0000 [IU] | PEN_INJECTOR | Freq: Two times a day (BID) | SUBCUTANEOUS | 11 refills | Status: DC

## 2022-01-12 MED ORDER — LIRAGLUTIDE 18 MG/3ML ~~LOC~~ SOPN
1.8000 mg | PEN_INJECTOR | Freq: Every day | SUBCUTANEOUS | 0 refills | Status: DC
  Filled 2022-01-12: qty 9, 30d supply, fill #0

## 2022-01-12 MED ORDER — METFORMIN HCL 500 MG PO TABS
500.0000 mg | ORAL_TABLET | Freq: Two times a day (BID) | ORAL | 2 refills | Status: DC
  Filled 2022-01-12: qty 60, 30d supply, fill #0

## 2022-01-12 NOTE — Telephone Encounter (Signed)
Requesting to speak with a nurse about medication. Please call pt back.  °

## 2022-01-12 NOTE — Progress Notes (Unsigned)
  [facility] Res[facility] Telephone Encounter Continuity Care Appointment  HPI:  This telephone encounter was created for [name] on 01/12/2022 for the following purpose/cc medication management. Patient reports that he has been taking Xultophy for his DM. Reports that current cost for this medicine is 150$ per month. Patient has previously been on Lantus 17 units and trulicity 1.5 weekly. Was having hypoglycemia  with 17 units of Lantus. He is willing to trial similar regimen again.    Past Medical History:  Past Medical History:  Diagnosis Date   Diabetes mellitus without complication (HCC)    Diabetic ulcer of foot with fat layer exposed (HCC) 10/23/2017     ROS:  See HPI   Assessment / Plan / Recommendations:  Please see A&P under problem oriented charting for assessment of the patient's acute and chronic medical conditions.  Unfortunately, most GLP1 medications will cost patient over 100$ per month. Will send Rx for Victoza through IM program. Will also restart lantus at 10 units to avoid hypoglycemia. Will also conitnue metformin. As always, pt is advised that if symptoms worsen or new symptoms arise, they should go to an urgent care facility or to to ER for further evaluation.   Consent and Medical Decision Making:  Patient discussed with Dr. Antony Contras This is a telephone encounter between Eulas Post and Adron Bene on 01/12/2022 for [facility] management. The visit was conducted with the patient located at home and Adron Bene at [facility]. The patient's identity was confirmed using their DOB and current address. The patient has consented to being evaluated through a telephone encounter and understands the associated risks (an examination cannot be done and the patient may need to come in for an appointment) / benefits (allows the patient to remain at home, decreasing exposure to coronavirus). I personally spent 15 minutes on medical discussion.

## 2022-01-12 NOTE — Telephone Encounter (Signed)
Please advise what your answer is to below request.

## 2022-01-12 NOTE — Telephone Encounter (Signed)
Patient was given tele appt for today at 2:45. He is currently sleeping as he works third shift. Spoke with patient's mother who will go to his house and ensure he is awake to receive call at ~2:45.

## 2022-01-13 ENCOUNTER — Other Ambulatory Visit (HOSPITAL_COMMUNITY): Payer: Self-pay

## 2022-01-13 ENCOUNTER — Telehealth: Payer: Self-pay

## 2022-01-13 ENCOUNTER — Encounter: Payer: Self-pay | Admitting: Internal Medicine

## 2022-01-13 NOTE — Telephone Encounter (Signed)
Prior Authorization for patient (victoza) came through on cover my meds was submitted with last office notes awaiting approval or denial 

## 2022-01-13 NOTE — Assessment & Plan Note (Signed)
Patient reports that he has been taking Xultophy for his DM. Reports that current cost for this medicine is 150$ per month. Patient has previously been on Lantus 17 units and trulicity 1.5 weekly. Was having hypoglycemia  with 17 units of Lantus. He is willing to trial similar regimen again.   Unfortunately, most GLP1 medications will cost patient over 100$ per month. Will send Rx for Victoza through IM program. Will also restart lantus at 10 units to avoid hypoglycemia. Will also conitnue metformin.

## 2022-01-14 ENCOUNTER — Other Ambulatory Visit (HOSPITAL_COMMUNITY): Payer: Self-pay

## 2022-01-15 ENCOUNTER — Encounter (INDEPENDENT_AMBULATORY_CARE_PROVIDER_SITE_OTHER): Payer: Managed Care, Other (non HMO) | Admitting: Ophthalmology

## 2022-01-15 ENCOUNTER — Other Ambulatory Visit (HOSPITAL_COMMUNITY): Payer: Self-pay

## 2022-01-15 DIAGNOSIS — E113313 Type 2 diabetes mellitus with moderate nonproliferative diabetic retinopathy with macular edema, bilateral: Secondary | ICD-10-CM

## 2022-01-15 DIAGNOSIS — H35033 Hypertensive retinopathy, bilateral: Secondary | ICD-10-CM | POA: Diagnosis not present

## 2022-01-15 DIAGNOSIS — I10 Essential (primary) hypertension: Secondary | ICD-10-CM | POA: Diagnosis not present

## 2022-01-15 DIAGNOSIS — H43813 Vitreous degeneration, bilateral: Secondary | ICD-10-CM

## 2022-01-15 NOTE — Telephone Encounter (Signed)
Decision:Denied Jaclynn Guarneri Key: [id number] - PA Case ID: [id number] - Rx #: [id number] Need help? Call us at [phone] Outcome Deniedon November 15 Request Reference Number: [id number]. VICTOZA INJ 18MG /3ML is denied for not meeting the prior authorization requirement(s). Details of this decision are in the notice attached below or have been faxed to you. Drug Victoza 18MG /3ML pen-injectors Form [facility] Electronic Prior Authorization Form (2017 NCPDP) Original Claim Info [phone] Call [phone] for PA Drug Requires Prior Authorization

## 2022-01-19 NOTE — Progress Notes (Signed)
Internal Medicine Clinic Attending  Case discussed with Dr. Burnice Logan  At the time of the visit.  We reviewed the resident's history and exam and pertinent patient test results.  I agree with the assessment, diagnosis, and plan of care documented in the resident's note.   Patient does not qualify for the IM program as he has insurance. This is only for our uninsured patients.

## 2022-02-12 ENCOUNTER — Encounter (INDEPENDENT_AMBULATORY_CARE_PROVIDER_SITE_OTHER): Payer: Managed Care, Other (non HMO) | Admitting: Ophthalmology

## 2022-02-12 DIAGNOSIS — H35371 Puckering of macula, right eye: Secondary | ICD-10-CM

## 2022-02-12 DIAGNOSIS — H35033 Hypertensive retinopathy, bilateral: Secondary | ICD-10-CM | POA: Diagnosis not present

## 2022-02-12 DIAGNOSIS — I10 Essential (primary) hypertension: Secondary | ICD-10-CM | POA: Diagnosis not present

## 2022-02-12 DIAGNOSIS — E113313 Type 2 diabetes mellitus with moderate nonproliferative diabetic retinopathy with macular edema, bilateral: Secondary | ICD-10-CM | POA: Diagnosis not present

## 2022-02-12 DIAGNOSIS — H43813 Vitreous degeneration, bilateral: Secondary | ICD-10-CM

## 2022-03-05 ENCOUNTER — Encounter: Payer: Managed Care, Other (non HMO) | Admitting: Internal Medicine

## 2022-03-05 NOTE — Progress Notes (Deleted)
DM: Current regimen is lantus 10 units BID, victoza 1.8 mg daily, metformin 500 mg BID. Last HbA1c 11/2021 9.7%, today is *. There was proteinuria noted on urine microalbumin/creatinine at that time as well, 50. He is on rosuvastatin 10 mg for secondary prevention. Plan: Repeat BMP at next OV.   HTN: BP at goal, *. Current regimen is lisinopril-HCTZ 40-25 mg daily. Plan:  DEP: PHQ-9 today *. Patient has been stable on current dose of sertraline 50 mg daily for several months now. He reports *. Plan:Continue sertraline 50 mg daily.  Colon: Referral previously placed to GI for colonoscopy however it appears that the referral is closed at this time, likely due to inability to contact patient to schedule. Plan: New referral placed.

## 2022-03-06 ENCOUNTER — Encounter: Payer: Self-pay | Admitting: Internal Medicine

## 2022-03-06 ENCOUNTER — Ambulatory Visit (INDEPENDENT_AMBULATORY_CARE_PROVIDER_SITE_OTHER): Payer: 59 | Admitting: Internal Medicine

## 2022-03-06 ENCOUNTER — Telehealth: Payer: Self-pay

## 2022-03-06 ENCOUNTER — Other Ambulatory Visit: Payer: Self-pay

## 2022-03-06 VITALS — BP 139/88 | HR 77 | Temp 97.8°F | Resp 28 | Ht 72.0 in | Wt 261.7 lb

## 2022-03-06 DIAGNOSIS — E1140 Type 2 diabetes mellitus with diabetic neuropathy, unspecified: Secondary | ICD-10-CM | POA: Diagnosis not present

## 2022-03-06 DIAGNOSIS — I10 Essential (primary) hypertension: Secondary | ICD-10-CM

## 2022-03-06 DIAGNOSIS — Z794 Long term (current) use of insulin: Secondary | ICD-10-CM

## 2022-03-06 DIAGNOSIS — F321 Major depressive disorder, single episode, moderate: Secondary | ICD-10-CM | POA: Diagnosis not present

## 2022-03-06 DIAGNOSIS — Z87891 Personal history of nicotine dependence: Secondary | ICD-10-CM

## 2022-03-06 LAB — POCT GLYCOSYLATED HEMOGLOBIN (HGB A1C): HbA1c POC (<> result, manual entry): >14 % — AB (ref 4.0–5.6)

## 2022-03-06 LAB — GLUCOSE, CAPILLARY: Glucose-Capillary: 316 mg/dL — ABNORMAL HIGH (ref 70–99)

## 2022-03-06 MED ORDER — LISINOPRIL-HYDROCHLOROTHIAZIDE 20-12.5 MG PO TABS: 2.0000 | ORAL_TABLET | Freq: Every day | ORAL | 5 refills | Status: AC

## 2022-03-06 MED ORDER — METFORMIN HCL 500 MG PO TABS: ORAL_TABLET | ORAL | 2 refills | Status: DC

## 2022-03-06 MED ORDER — FREESTYLE LIBRE 3 SENSOR MISC: 1.0000 | 11 refills | Status: DC

## 2022-03-06 MED ORDER — SERTRALINE HCL 50 MG PO TABS: 50.0000 mg | ORAL_TABLET | Freq: Every day | ORAL | 2 refills | Status: DC

## 2022-03-06 MED ORDER — ROSUVASTATIN CALCIUM 10 MG PO TABS: 10.0000 mg | ORAL_TABLET | Freq: Every day | ORAL | 11 refills | Status: AC

## 2022-03-06 MED ORDER — XULTOPHY 100-3.6 UNIT-MG/ML ~~LOC~~ SOPN: 30.0000 [IU] | PEN_INJECTOR | Freq: Every day | SUBCUTANEOUS | 2 refills | Status: DC

## 2022-03-06 NOTE — Telephone Encounter (Signed)
Prior Authorization for patient (Xultophy) came through on cover my meds was submitted with last office notes and labs awaiting approval or denial 

## 2022-03-06 NOTE — Patient Instructions (Addendum)
Jason Monroe,  It was great to see you again today; I'm glad you are doing well!  I have sent in refills for your prescriptions to [facility] on [address]. For your diabetes specifically, I have sent in: Metformin: Increase to 500 mg twice daily with a meal for 7 days; THEN increase to 1000 mg with breakfast and 500 mg with dinner for 7 days; THEN increase to 1000 mg twice daily with a meal. Xultophy: You can either start at 15 units daily and increase by 2 units every 3 days until you reach 30 units daily, or start back with 30 units daily from the start. I have sent in a Freestyle Libre  Please follow up with Butch Penny for CGM placement and education at her next available slot, and with our clinic about 1 month after that.  Please let us know if you have any other questions!  My best, Dr. Marlou Sa

## 2022-03-06 NOTE — Assessment & Plan Note (Signed)
Patient continues to feel satisfied with sertraline 50 mg daily. He is requesting the medication be sent to a new pharmacy. Plan:Refill sent to new pharmacy.

## 2022-03-06 NOTE — Assessment & Plan Note (Addendum)
HbA1c last checked 11/2021 was 9.7%, today is >14.0%. Patient recently changed regimen to victoza 1.8 mg daily, lantus 10 units BID, and metformin 500 mg BID however he has not been able to take the victoza or lantus due to insurance and financial barriers, and has only been taking metformin once daily. He has now been able to obtain better coverage, specifically under a diabetes plan, that has much better coverage of his medication needs. He does not regularly check his blood sugar at home. He has increased his exercise and has lost about 13 pounds since October. He is working on better portion control as well. Microalbumin/creatinine ratio 11/2021 was notable for moderately increased ratio at 50. BMP with normal renal function at that time. He has had polydipsia and polyuria over the last 1 month since he has not had medication other than metformin for his diabetes. Plan:Titrate metformin to 1000 mg twice daily. Restart Xultophy 30 units daily. Patient instructed that he can start at 15 units daily and titrate up by 2 units daily every 3 days to meet goal of 30 units, or restart at 30 units from the start. Because he has worn Freestyle Libre in the past, he is comfortable with placing the reader himself at home. Follow-up in about 1 month to trend daily CBG log and titrate medication. Repeat urine microalbumin/creatinine in 1 year.

## 2022-03-06 NOTE — Assessment & Plan Note (Signed)
BP at goal today, 139/88. Current regimen is lisinopril-HCTZ 20-12.5 mg daily. Plan: Continue current regimen.

## 2022-03-06 NOTE — Progress Notes (Signed)
   CC: T2DM f/u  HPI:  Jason Monroe is a 52 y.o. person with past medical history as detailed below who presents for T2DM follow-up. Please see problem based charting for detailed assessment and plan.  Past Medical History:  Diagnosis Date   Diabetes mellitus without complication ([place])    Diabetic ulcer of foot with fat layer exposed ([place]) 10/23/2017   Review of Systems:  Negative unless otherwise stated.  Physical Exam:  Vitals:   03/06/22 0908  BP: 139/88  Pulse: 77  Resp: (!) 28  Temp: 97.8 F (36.6 C)  TempSrc: Oral  SpO2: 100%  Weight: 261 lb 11.2 oz (118.7 kg)  Height: 6' (1.829 m)   Constitutional:Appears stated age, well. In no acute distress. Cardio:Regular rate and rhythm. No murmurs, rubs, or gallops. Pulm:Clear to auscultation bilaterally. Normal work of breathing on room air. TMA:[id number] for extremity edema. Skin:Warm and dry. Neuro:Alert and oriented x3. No focal deficit noted. Psych:Pleasant mood and affect.  Assessment & Plan:   See Encounters Tab for problem based charting.  Essential hypertension BP at goal today, 139/88. Current regimen is lisinopril-HCTZ 20-12.5 mg daily. Plan: Continue current regimen.  Diabetes mellitus with diabetic neuropathy, with long-term current use of insulin ([place]) HbA1c last checked 11/2021 was 9.7%, today is >14.0%. Patient recently changed regimen to victoza 1.8 mg daily, lantus 10 units BID, and metformin 500 mg BID however he has not been able to take the victoza or lantus due to insurance and financial barriers, and has only been taking metformin once daily. He has now been able to obtain better coverage, specifically under a diabetes plan, that has much better coverage of his medication needs. He does not regularly check his blood sugar at home. He has increased his exercise and has lost about 13 pounds since October. He is working on better portion control as well. Microalbumin/creatinine ratio 11/2021 was notable  for moderately increased ratio at 50. BMP with normal renal function at that time. He has had polydipsia and polyuria over the last 1 month since he has not had medication other than metformin for his diabetes. Plan:Titrate metformin to 1000 mg twice daily. Restart Xultophy 30 units daily. Patient instructed that he can start at 15 units daily and titrate up by 2 units daily every 3 days to meet goal of 30 units, or restart at 30 units from the start. Because he has worn Freestyle Libre in the past, he is comfortable with placing the reader himself at home. Follow-up in about 1 month to trend daily CBG log and titrate medication. Repeat urine microalbumin/creatinine in 1 year.  Depression Patient continues to feel satisfied with sertraline 50 mg daily. He is requesting the medication be sent to a new pharmacy. Plan:Refill sent to new pharmacy.  Patient discussed with Dr. Heber [place]

## 2022-03-06 NOTE — Progress Notes (Signed)
Internal Medicine Clinic Attending  Case discussed with the resident at the time of the visit.  We reviewed the resident's history and exam and pertinent patient test results.  I agree with the assessment, diagnosis, and plan of care documented in the resident's note.  

## 2022-03-09 NOTE — Telephone Encounter (Signed)
Decision:Approved Joen Laura (Key: [id number]) Rx #: [id number] Xultophy 100/3.6 Units-mg/mL Form [facility] Electronic Prior Authorization Form [phone] NCPDP)  Message from Plan Request Reference Number: [id number]. XULTOPHY INJ 100/3.6 is approved through 03/07/2023. Your patient may now fill this prescription and it will be covered.

## 2022-03-13 ENCOUNTER — Encounter (INDEPENDENT_AMBULATORY_CARE_PROVIDER_SITE_OTHER): Payer: 59 | Admitting: Ophthalmology

## 2022-03-13 DIAGNOSIS — H35033 Hypertensive retinopathy, bilateral: Secondary | ICD-10-CM | POA: Diagnosis not present

## 2022-03-13 DIAGNOSIS — H43813 Vitreous degeneration, bilateral: Secondary | ICD-10-CM

## 2022-03-13 DIAGNOSIS — I10 Essential (primary) hypertension: Secondary | ICD-10-CM | POA: Diagnosis not present

## 2022-03-13 DIAGNOSIS — H35372 Puckering of macula, left eye: Secondary | ICD-10-CM

## 2022-03-13 DIAGNOSIS — E113313 Type 2 diabetes mellitus with moderate nonproliferative diabetic retinopathy with macular edema, bilateral: Secondary | ICD-10-CM | POA: Diagnosis not present

## 2022-03-26 ENCOUNTER — Ambulatory Visit (INDEPENDENT_AMBULATORY_CARE_PROVIDER_SITE_OTHER): Payer: 59 | Admitting: Podiatry

## 2022-03-26 DIAGNOSIS — E1149 Type 2 diabetes mellitus with other diabetic neurological complication: Secondary | ICD-10-CM | POA: Diagnosis not present

## 2022-03-26 DIAGNOSIS — L84 Corns and callosities: Secondary | ICD-10-CM | POA: Diagnosis not present

## 2022-03-31 NOTE — Progress Notes (Signed)
Subjective: Chief Complaint  Patient presents with   routine foot care     [facility]    Presents for above complaint.  Denies any open lesions.  No increased swelling or redness.  No fevers or chills.  Reports.  Last A1c > 14 on 03/06/2022.    Objective: AAO x3, NAD DP/PT pulses palpable bilaterally, CRT less than 3 seconds Sensation decreased. Hyperkeratotic lesion plantar aspect right foot submetatarsal area along the area of prior scar.  Upon debridement there is no underlying ulceration drainage or signs of infection.  There was some minimal dried blood present however.  There is no drainage or pus or any signs of infection.  Also callus plantar hallux without any ulcerations or signs of infection. No pain with calf compression, swelling, warmth, erythema  Assessment: Hyperkeratotic lesion right foot which is preulcerative  Plan: -All treatment options discussed with the patient including all alternatives, risks, complications.  -Sharply debrided lesion without any complications or bleeding x2.  Recommend to continue with offloading and moisturizer daily.  -Glucose control  -Daily foot inspection  -Patient encouraged to call the office with any questions, concerns, change in symptoms.   Trula Slade DPM

## 2022-04-08 ENCOUNTER — Ambulatory Visit (INDEPENDENT_AMBULATORY_CARE_PROVIDER_SITE_OTHER): Payer: 59 | Admitting: Student

## 2022-04-08 ENCOUNTER — Encounter: Payer: Self-pay | Admitting: Student

## 2022-04-08 ENCOUNTER — Other Ambulatory Visit: Payer: Self-pay

## 2022-04-08 VITALS — BP 129/85 | HR 83 | Temp 97.7°F | Ht 72.0 in | Wt 263.7 lb

## 2022-04-08 DIAGNOSIS — E1140 Type 2 diabetes mellitus with diabetic neuropathy, unspecified: Secondary | ICD-10-CM | POA: Diagnosis not present

## 2022-04-08 DIAGNOSIS — F325 Major depressive disorder, single episode, in full remission: Secondary | ICD-10-CM

## 2022-04-08 DIAGNOSIS — Z794 Long term (current) use of insulin: Secondary | ICD-10-CM | POA: Diagnosis not present

## 2022-04-08 NOTE — Progress Notes (Unsigned)
   CC: Diabetes follow-up  HPI:  [name] is a 52 y.o. male with PMH as below who presents to clinic to follow-up on his diabetes. Please see problem based charting for evaluation, assessment and plan.  Past Medical History:  Diagnosis Date   Diabetes mellitus without complication ([place])    Diabetic ulcer of foot with fat layer exposed ([place]) 10/23/2017    Review of Systems:  Constitutional: Negative for fever, polydipsia or fatigue Eyes: Negative for visual changes GU: Negative for polyuria. Neuro: Negative for headache, dizziness or weakness Psych: Negative for depression or anxiety  Physical Exam: General: Pleasant, pleasant well-appearing middle-age man.  No acute distress. Cardiac: RRR. No murmurs, rubs or gallops. No LE edema Respiratory: Lungs CTAB. No wheezing or crackles. Abdominal: Soft, symmetric and non tender. Normal BS. Skin: Warm, dry and intact without rashes or lesions Extremities: Radial and DP pulses 2+ and symmetric. 2 small calluses on the plantar aspect of the right foot. Neuro: A&O x 3. Moves all extremities. Normal sensation to gross touch. Psych: Appropriate mood and affect.  Vitals:   04/08/22 1417  BP: 129/85  Pulse: 83  Temp: 97.7 F (36.5 C)  TempSrc: Oral  SpO2: 99%  Weight: 263 lb 11.2 oz (119.6 kg)  Height: 6' (1.829 m)    Assessment & Plan:   Diabetes mellitus with diabetic neuropathy, with long-term current use of insulin ([place]) Patient here for 1 month follow-up on his diabetes. Patient was restarted on his insulin during last office visit after a gap in his care due to lack of insurance. Found to have A1c >14%. Patient states he has been able to increase his Xultophy to 30 units daily and currently titrating up his metformin. He reports some GI symptoms initially with the metformin but this resolved after the first week and he is currently on 1500 mg of metformin daily with plan to escalate to 2000 mg next week. Patient states he  has felt much better since resuming the insulin. He denies any polyuria, polydipsia, dizziness or blurry vision. He has been trying to exercise and eat better to help improve his diabetes. He has not been checking his blood sugars at home. States he did not have his freestyle libre sensor but he was just informed that this is ready for him to pick up.  Discussed plan for patient to attach the sensor and follow-up with [place] after 2 weeks for download and adjustment to his insulin regimen.  Diabetic foot exam completed today that is overall normal with palpable DP and PT pulses. He does have a callus under his right foot.  Patient follows with podiatry for management of a hyperkeratotic lesion under the right foot that is presumed to be preulcerative  Plan: -Continues Xultophy 30 units daily -Continue escalating dose of metformin to max dose of 1000 mg twice daily -Continue lifestyle modifications with dietary changes and exercise -Follow-up with retina specialist as scheduled on 04/10/2022 -Apply freestyle libre sensor and follow-up in 2 weeks for reevaluation -Follow-up with podiatrist as needed  Depression PHQ-9 today is 0. States his mood is much better. -Continue sertraline 50 mg daily    See Encounters Tab for problem based charting.  Patient discussed with Dr.  Dani Gobble, MD, MPH

## 2022-04-08 NOTE — Patient Instructions (Addendum)
Thank you, Mr.Nollie H Wetsel for allowing [place] to provide your care today. Today we discussed your diabetes and hypertension.  I am glad that you are feeling much better.  Continue escalating your metformin to the max dose.  Apply the sensor so we can start getting blood sugar readings to help [place] determine changes to your regimen.  My Chart Access: [email or web address]  Please follow-up in 2-4 weeks after placing a sensor  Please make sure to arrive 15 minutes prior to your next appointment. If you arrive late, you may be asked to reschedule.    We look forward to seeing you next time. Please call our clinic at [phone] if you have any questions or concerns. The best time to call is Monday-Friday from 9am-4pm, but there is someone available 24/7. If after hours or the weekend, call the main hospital number and ask for the Internal Medicine Resident On-Call. If you need medication refills, please notify your pharmacy one week in advance and they will send [place] a request.   Thank you for letting us take part in your care. Wishing you the best!  Lacinda Axon, MD 04/08/2022, 3:15 PM IM Resident, PGY-3 Oswaldo Milian 41:10

## 2022-04-09 ENCOUNTER — Encounter: Payer: Self-pay | Admitting: Student

## 2022-04-09 NOTE — Assessment & Plan Note (Signed)
PHQ-9 today is 0. States his mood is much better. -Continue sertraline 50 mg daily

## 2022-04-09 NOTE — Assessment & Plan Note (Addendum)
Patient here for 1 month follow-up on his diabetes. Patient was restarted on his insulin during last office visit after a gap in his care due to lack of insurance. Found to have A1c >14%. Patient states he has been able to increase his Xultophy to 30 units daily and currently titrating up his metformin. He reports some GI symptoms initially with the metformin but this resolved after the first week and he is currently on 1500 mg of metformin daily with plan to escalate to 2000 mg next week. Patient states he has felt much better since resuming the insulin. He denies any polyuria, polydipsia, dizziness or blurry vision. He has been trying to exercise and eat better to help improve his diabetes. He has not been checking his blood sugars at home. States he did not have his freestyle libre sensor but he was just informed that this is ready for him to pick up.  Discussed plan for patient to attach the sensor and follow-up with [place] after 2 weeks for download and adjustment to his insulin regimen.  Diabetic foot exam completed today that is overall normal with palpable DP and PT pulses. He does have a callus under his right foot.  Patient follows with podiatry for management of a hyperkeratotic lesion under the right foot that is presumed to be preulcerative  Plan: -Continues Xultophy 30 units daily -Continue escalating dose of metformin to max dose of 1000 mg twice daily -Continue lifestyle modifications with dietary changes and exercise -Follow-up with retina specialist as scheduled on 04/10/2022 -Apply freestyle libre sensor and follow-up in 2 weeks for reevaluation -Follow-up with podiatrist as needed

## 2022-04-10 ENCOUNTER — Encounter (INDEPENDENT_AMBULATORY_CARE_PROVIDER_SITE_OTHER): Payer: 59 | Admitting: Ophthalmology

## 2022-04-10 ENCOUNTER — Telehealth: Payer: Self-pay | Admitting: *Deleted

## 2022-04-10 DIAGNOSIS — E113313 Type 2 diabetes mellitus with moderate nonproliferative diabetic retinopathy with macular edema, bilateral: Secondary | ICD-10-CM | POA: Diagnosis not present

## 2022-04-10 DIAGNOSIS — I10 Essential (primary) hypertension: Secondary | ICD-10-CM | POA: Diagnosis not present

## 2022-04-10 DIAGNOSIS — H43813 Vitreous degeneration, bilateral: Secondary | ICD-10-CM

## 2022-04-10 DIAGNOSIS — H35372 Puckering of macula, left eye: Secondary | ICD-10-CM

## 2022-04-10 DIAGNOSIS — H35033 Hypertensive retinopathy, bilateral: Secondary | ICD-10-CM | POA: Diagnosis not present

## 2022-04-10 NOTE — Progress Notes (Signed)
Internal Medicine Clinic Attending  Case discussed with Dr. A[place]ons[place]  At the time of the visit.  We reviewed the resident's history and exam and pertinent patient test results.  I agree with the assessment, diagnosis, and plan of care documented in the resident's note.  

## 2022-04-10 NOTE — Telephone Encounter (Signed)
Okay, thanks for the update. Not sure why that is the case but doesn't look like it is on our end.

## 2022-04-10 NOTE — Telephone Encounter (Signed)
Patient's mother called in stating patient is out of sertraline, and rosuvastatin, and Suzie Portela is saying insurance won't cover these meds till 2/14. Confirmed with Heather at [facility] that Rxs were sent and p/u on 1/5.   Rosuvastatin can be filled today, but sertraline, Zestoretic, and metformin cannot be filled till 2/14. Mother advised  to contact insurance company.

## 2022-04-20 ENCOUNTER — Other Ambulatory Visit: Payer: Self-pay

## 2022-04-20 DIAGNOSIS — Z794 Long term (current) use of insulin: Secondary | ICD-10-CM

## 2022-04-20 MED ORDER — FREESTYLE LIBRE 3 SENSOR MISC: 1.0000 | 11 refills | Status: AC

## 2022-04-20 MED ORDER — METFORMIN HCL 1000 MG PO TABS: 1000.0000 mg | ORAL_TABLET | Freq: Two times a day (BID) | ORAL | 2 refills | Status: AC

## 2022-04-30 ENCOUNTER — Encounter: Payer: Self-pay | Admitting: Student

## 2022-04-30 ENCOUNTER — Other Ambulatory Visit: Payer: Self-pay

## 2022-04-30 ENCOUNTER — Ambulatory Visit (INDEPENDENT_AMBULATORY_CARE_PROVIDER_SITE_OTHER): Payer: 59 | Admitting: Student

## 2022-04-30 VITALS — BP 137/91 | HR 88 | Temp 98.7°F | Ht 72.0 in | Wt 266.2 lb

## 2022-04-30 DIAGNOSIS — Z794 Long term (current) use of insulin: Secondary | ICD-10-CM

## 2022-04-30 DIAGNOSIS — E1140 Type 2 diabetes mellitus with diabetic neuropathy, unspecified: Secondary | ICD-10-CM

## 2022-04-30 MED ORDER — FREESTYLE LIBRE 3 READER DEVI: 1.0000 | Freq: Once | 11 refills | Status: AC

## 2022-04-30 NOTE — Progress Notes (Signed)
   CC: diabetic supplies  HPI:  [name] is a 52 y.o. person with medical history as below presenting to [facility] for diabetic supplies.  Please see problem-based list for further details, assessments, and plans.  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  Skin: Warm, dry. No rashes or lesions. Neuro: Awake, alert, conversing appropriately. Grossly non-focal. Psych: Normal mood, affect, speech.   Assessment & Plan:   Diabetes mellitus with diabetic neuropathy, with long-term current use of insulin [facility]) Patient presenting today as he was unable to start personal CGM after last visit as he did not have the reader for this. He typically does not use his cell phone for extraneous things. He has been doing well with the Xultophy 30 units daily. He is also taking metformin 1072m twice daily - states he had some loose stool initially with this but that has resolved.   - Libre Sensor and Reader sent to pharmacy - Continue Xultophy 30u daily - Metformin 10064mtwice daily - Return in two weeks to assess sugars  Patient discussed with Dr. HoDenita LungMD Internal Medicine PGY-3 Pager: [phone]

## 2022-04-30 NOTE — Patient Instructions (Signed)
Jason Monroe, it was a pleasure seeing you today!  Today we discussed: - I have sent in the reader for the [place]. Please return in a few weeks to see how you are doing.   I have ordered the following medication/changed the following medications:   Start the following medications: Meds ordered this encounter  Medications   Continuous Blood Gluc Receiver (FREESTYLE LIBRE 3 READER) DEVI    Sig: 1 applicator by Does not apply route once for 1 dose.    Dispense:  2 each    Refill:  11     Follow-up:  2 weeks    Please make sure to arrive 15 minutes prior to your next appointment. If you arrive late, you may be asked to reschedule.   We look forward to seeing you next time. Please call our clinic at [phone] if you have any questions or concerns. The best time to call is Monday-Friday from 9am-4pm, but there is someone available 24/7. If after hours or the weekend, call the main hospital number and ask for the Internal Medicine Resident On-Call. If you need medication refills, please notify your pharmacy one week in advance and they will send [place] a request.  Thank you for letting us take part in your care. Wishing you the best!  Thank you, Sanjuan Dame, MD

## 2022-04-30 NOTE — Assessment & Plan Note (Signed)
Patient presenting today as he was unable to start personal CGM after last visit as he did not have the reader for this. He typically does not use his cell phone for extraneous things. He has been doing well with the Xultophy 30 units daily. He is also taking metformin '1000mg'$  twice daily - states he had some loose stool initially with this but that has resolved.   - Libre Sensor and Reader sent to pharmacy - Continue Xultophy 30u daily - Metformin '1000mg'$  twice daily - Return in two weeks to assess sugars

## 2022-04-30 NOTE — Telephone Encounter (Signed)
Error

## 2022-05-04 NOTE — Progress Notes (Signed)
Internal Medicine Clinic Attending  Case discussed with the resident at the time of the visit.  We reviewed the resident's history and exam and pertinent patient test results.  I agree with the assessment, diagnosis, and plan of care documented in the resident's note.  

## 2022-05-07 ENCOUNTER — Telehealth: Payer: Self-pay

## 2022-05-07 NOTE — Telephone Encounter (Addendum)
Prior Authorization for patient [employer] libre 3 sensor/receiver device) came through on cover my meds was submitted with last office notes awaiting approval or denial

## 2022-05-08 ENCOUNTER — Encounter (INDEPENDENT_AMBULATORY_CARE_PROVIDER_SITE_OTHER): Payer: 59 | Admitting: Ophthalmology

## 2022-05-08 DIAGNOSIS — E113313 Type 2 diabetes mellitus with moderate nonproliferative diabetic retinopathy with macular edema, bilateral: Secondary | ICD-10-CM | POA: Diagnosis not present

## 2022-05-08 DIAGNOSIS — I10 Essential (primary) hypertension: Secondary | ICD-10-CM

## 2022-05-08 DIAGNOSIS — H35033 Hypertensive retinopathy, bilateral: Secondary | ICD-10-CM

## 2022-05-08 DIAGNOSIS — H35372 Puckering of macula, left eye: Secondary | ICD-10-CM

## 2022-05-08 DIAGNOSIS — H2513 Age-related nuclear cataract, bilateral: Secondary | ICD-10-CM

## 2022-05-08 DIAGNOSIS — H43813 Vitreous degeneration, bilateral: Secondary | ICD-10-CM

## 2022-05-11 ENCOUNTER — Ambulatory Visit: Payer: 59 | Admitting: Podiatry

## 2022-05-11 NOTE — Telephone Encounter (Signed)
Decision:Denied  

## 2022-05-13 ENCOUNTER — Telehealth: Payer: Self-pay

## 2022-05-13 NOTE — Telephone Encounter (Signed)
Pa for pt ( FREESTYLE LIBRE  3 READER )  came through via fax  from pharmacy .Marland Kitchen Was submitted with last office notes and labs .Marland Kitchen Awaiting  approval  or denial

## 2022-05-15 NOTE — Telephone Encounter (Signed)
UPDATE:    [employer] is reviewing your PA request. Typically an electronic response will be received within 24-72 hours.  

## 2022-05-18 NOTE — Telephone Encounter (Signed)
Decision:Denied  

## 2022-05-25 NOTE — Telephone Encounter (Signed)
Can we please call and let him know this? We can send in whichever they prefer. thanks

## 2022-05-25 NOTE — Telephone Encounter (Signed)
Lvm for patient regarding previous message. Instructed patient to give [place] a call back if he has any questions.

## 2022-05-25 NOTE — Telephone Encounter (Signed)
Would any of the CGM's be covered by their insurance? Thanks

## 2022-05-25 NOTE — Telephone Encounter (Signed)
Patient will need to call the number on the back of his insurance card and ask them which cgm's do they cover.

## 2022-06-03 ENCOUNTER — Other Ambulatory Visit: Payer: Self-pay | Admitting: Internal Medicine

## 2022-06-03 DIAGNOSIS — F321 Major depressive disorder, single episode, moderate: Secondary | ICD-10-CM

## 2022-06-05 ENCOUNTER — Encounter (INDEPENDENT_AMBULATORY_CARE_PROVIDER_SITE_OTHER): Payer: 59 | Admitting: Ophthalmology

## 2022-06-05 DIAGNOSIS — E113313 Type 2 diabetes mellitus with moderate nonproliferative diabetic retinopathy with macular edema, bilateral: Secondary | ICD-10-CM | POA: Diagnosis not present

## 2022-06-05 DIAGNOSIS — I10 Essential (primary) hypertension: Secondary | ICD-10-CM

## 2022-06-05 DIAGNOSIS — H35372 Puckering of macula, left eye: Secondary | ICD-10-CM | POA: Diagnosis not present

## 2022-06-05 DIAGNOSIS — H43813 Vitreous degeneration, bilateral: Secondary | ICD-10-CM

## 2022-06-05 DIAGNOSIS — H35033 Hypertensive retinopathy, bilateral: Secondary | ICD-10-CM

## 2022-07-03 ENCOUNTER — Encounter (INDEPENDENT_AMBULATORY_CARE_PROVIDER_SITE_OTHER): Payer: 59 | Admitting: Ophthalmology

## 2022-07-03 DIAGNOSIS — H43813 Vitreous degeneration, bilateral: Secondary | ICD-10-CM | POA: Diagnosis not present

## 2022-07-03 DIAGNOSIS — E113313 Type 2 diabetes mellitus with moderate nonproliferative diabetic retinopathy with macular edema, bilateral: Secondary | ICD-10-CM

## 2022-07-03 DIAGNOSIS — I10 Essential (primary) hypertension: Secondary | ICD-10-CM | POA: Diagnosis not present

## 2022-07-03 DIAGNOSIS — H35033 Hypertensive retinopathy, bilateral: Secondary | ICD-10-CM

## 2022-07-03 DIAGNOSIS — H2513 Age-related nuclear cataract, bilateral: Secondary | ICD-10-CM

## 2022-07-03 DIAGNOSIS — Z7985 Long-term (current) use of injectable non-insulin antidiabetic drugs: Secondary | ICD-10-CM

## 2022-07-07 ENCOUNTER — Other Ambulatory Visit: Payer: Self-pay

## 2022-07-07 DIAGNOSIS — F321 Major depressive disorder, single episode, moderate: Secondary | ICD-10-CM

## 2022-07-09 NOTE — Telephone Encounter (Signed)
Can we please get him scheduled for a f/u? He was recommended to have a 2-week f/u 02/29. Per Dr August Saucer.

## 2022-07-21 ENCOUNTER — Encounter: Payer: 59 | Admitting: Student

## 2022-07-21 ENCOUNTER — Other Ambulatory Visit: Payer: Self-pay

## 2022-07-21 DIAGNOSIS — E1140 Type 2 diabetes mellitus with diabetic neuropathy, unspecified: Secondary | ICD-10-CM

## 2022-07-22 MED ORDER — XULTOPHY 100-3.6 UNIT-MG/ML ~~LOC~~ SOPN
30.0000 [IU] | PEN_INJECTOR | Freq: Every day | SUBCUTANEOUS | 2 refills | Status: AC
Start: 2022-07-22 — End: ?

## 2022-07-31 ENCOUNTER — Encounter (INDEPENDENT_AMBULATORY_CARE_PROVIDER_SITE_OTHER): Payer: 59 | Admitting: Ophthalmology

## 2022-07-31 DIAGNOSIS — E113313 Type 2 diabetes mellitus with moderate nonproliferative diabetic retinopathy with macular edema, bilateral: Secondary | ICD-10-CM

## 2022-07-31 DIAGNOSIS — H35033 Hypertensive retinopathy, bilateral: Secondary | ICD-10-CM

## 2022-07-31 DIAGNOSIS — I10 Essential (primary) hypertension: Secondary | ICD-10-CM | POA: Diagnosis not present

## 2022-07-31 DIAGNOSIS — H43813 Vitreous degeneration, bilateral: Secondary | ICD-10-CM

## 2022-07-31 DIAGNOSIS — Z7985 Long-term (current) use of injectable non-insulin antidiabetic drugs: Secondary | ICD-10-CM

## 2022-08-24 ENCOUNTER — Other Ambulatory Visit: Payer: Self-pay | Admitting: Internal Medicine

## 2022-08-24 DIAGNOSIS — F321 Major depressive disorder, single episode, moderate: Secondary | ICD-10-CM

## 2022-08-25 ENCOUNTER — Other Ambulatory Visit: Payer: Self-pay | Admitting: Internal Medicine

## 2022-08-25 DIAGNOSIS — F321 Major depressive disorder, single episode, moderate: Secondary | ICD-10-CM

## 2022-08-28 ENCOUNTER — Encounter (INDEPENDENT_AMBULATORY_CARE_PROVIDER_SITE_OTHER): Payer: 59 | Admitting: Ophthalmology

## 2022-08-28 DIAGNOSIS — Z7985 Long-term (current) use of injectable non-insulin antidiabetic drugs: Secondary | ICD-10-CM

## 2022-08-28 DIAGNOSIS — I10 Essential (primary) hypertension: Secondary | ICD-10-CM

## 2022-08-28 DIAGNOSIS — H35372 Puckering of macula, left eye: Secondary | ICD-10-CM | POA: Diagnosis not present

## 2022-08-28 DIAGNOSIS — H35033 Hypertensive retinopathy, bilateral: Secondary | ICD-10-CM | POA: Diagnosis not present

## 2022-08-28 DIAGNOSIS — H43813 Vitreous degeneration, bilateral: Secondary | ICD-10-CM

## 2022-08-28 DIAGNOSIS — E113313 Type 2 diabetes mellitus with moderate nonproliferative diabetic retinopathy with macular edema, bilateral: Secondary | ICD-10-CM

## 2022-09-24 ENCOUNTER — Ambulatory Visit (INDEPENDENT_AMBULATORY_CARE_PROVIDER_SITE_OTHER): Payer: 59 | Admitting: Podiatry

## 2022-09-24 DIAGNOSIS — E1149 Type 2 diabetes mellitus with other diabetic neurological complication: Secondary | ICD-10-CM | POA: Diagnosis not present

## 2022-09-24 DIAGNOSIS — L84 Corns and callosities: Secondary | ICD-10-CM

## 2022-09-24 NOTE — Progress Notes (Signed)
Subjective: No chief complaint on file.   52 year old male presents to the office today for diabetic foot evaluation.  He states he has been doing well.  Does not report any ulcerations.  No swelling or redness.   Last A1c > 14 on 03/06/2022. It has not ben rechecked.    Objective: AAO x3, NAD DP/PT pulses palpable bilaterally, CRT less than 3 seconds Sensation decreased. Hyperkeratotic lesion plantar aspect right foot submetatarsal area along the area of prior scar.  There is no underlying ulceration drainage or any signs of infection noted today.  There are no open lesions today.  There is no area of tenderness noted bilaterally.  Flexor, extensor tendons are intact.   No pain with calf compression, swelling, warmth, erythema  Assessment: Hyperkeratotic lesion right foot which is preulcerative  Plan: -All treatment options discussed with the patient including all alternatives, risks, complications.  -Sharply debrided lesion without any complications or bleeding x2.  Recommend to continue with offloading and moisturizer daily.  -Glucose control  -Daily foot inspection  -Patient encouraged to call the office with any questions, concerns, change in symptoms.   Vivi Barrack DPM

## 2022-09-25 ENCOUNTER — Encounter (INDEPENDENT_AMBULATORY_CARE_PROVIDER_SITE_OTHER): Payer: 59 | Admitting: Ophthalmology

## 2022-09-25 DIAGNOSIS — E113313 Type 2 diabetes mellitus with moderate nonproliferative diabetic retinopathy with macular edema, bilateral: Secondary | ICD-10-CM | POA: Diagnosis not present

## 2022-09-25 DIAGNOSIS — I10 Essential (primary) hypertension: Secondary | ICD-10-CM

## 2022-09-25 DIAGNOSIS — Z7985 Long-term (current) use of injectable non-insulin antidiabetic drugs: Secondary | ICD-10-CM

## 2022-09-25 DIAGNOSIS — H35033 Hypertensive retinopathy, bilateral: Secondary | ICD-10-CM

## 2022-09-25 DIAGNOSIS — H35372 Puckering of macula, left eye: Secondary | ICD-10-CM | POA: Diagnosis not present

## 2022-09-25 DIAGNOSIS — H43813 Vitreous degeneration, bilateral: Secondary | ICD-10-CM

## 2022-10-01 ENCOUNTER — Other Ambulatory Visit: Payer: Self-pay | Admitting: Internal Medicine

## 2022-10-01 DIAGNOSIS — F321 Major depressive disorder, single episode, moderate: Secondary | ICD-10-CM

## 2022-10-23 ENCOUNTER — Encounter (INDEPENDENT_AMBULATORY_CARE_PROVIDER_SITE_OTHER): Payer: 59 | Admitting: Ophthalmology

## 2022-10-23 DIAGNOSIS — E113313 Type 2 diabetes mellitus with moderate nonproliferative diabetic retinopathy with macular edema, bilateral: Secondary | ICD-10-CM

## 2022-10-23 DIAGNOSIS — I10 Essential (primary) hypertension: Secondary | ICD-10-CM | POA: Diagnosis not present

## 2022-10-23 DIAGNOSIS — H35372 Puckering of macula, left eye: Secondary | ICD-10-CM

## 2022-10-23 DIAGNOSIS — H43813 Vitreous degeneration, bilateral: Secondary | ICD-10-CM

## 2022-10-23 DIAGNOSIS — H35033 Hypertensive retinopathy, bilateral: Secondary | ICD-10-CM | POA: Diagnosis not present

## 2022-10-23 DIAGNOSIS — Z7985 Long-term (current) use of injectable non-insulin antidiabetic drugs: Secondary | ICD-10-CM

## 2022-11-19 ENCOUNTER — Telehealth: Payer: Self-pay

## 2022-11-19 NOTE — Telephone Encounter (Signed)
Decision:Approved Godfrey Hostetler (KeyZella Richer) PA Case ID #: [id number] Need Help? Call us at [phone] Outcome Approved today by [facility] 2017 NCPDP Request Reference Number: [id number]. DEXCOM G6 MIS RECEIVER is approved through 11/19/2023. Your patient may now fill this prescription and it will be covered. Authorization Expiration Date: 11/19/2023 Drug Dexcom G6 Receiver device ePA cloud logo Form [facility] Electronic Prior Authorization Form [phone] NCPDP)

## 2022-11-19 NOTE — Telephone Encounter (Signed)
Prior Authorization for patient (Dexcom G6 Receiver) came through on cover my meds was submitted with last office notes and labs awaiting approval or denial.  ZOX:[id number]

## 2022-11-19 NOTE — Telephone Encounter (Signed)
Error

## 2022-11-20 ENCOUNTER — Encounter (INDEPENDENT_AMBULATORY_CARE_PROVIDER_SITE_OTHER): Payer: 59 | Admitting: Ophthalmology

## 2022-11-20 DIAGNOSIS — E113313 Type 2 diabetes mellitus with moderate nonproliferative diabetic retinopathy with macular edema, bilateral: Secondary | ICD-10-CM

## 2022-11-20 DIAGNOSIS — H43813 Vitreous degeneration, bilateral: Secondary | ICD-10-CM

## 2022-11-20 DIAGNOSIS — H35033 Hypertensive retinopathy, bilateral: Secondary | ICD-10-CM

## 2022-11-20 DIAGNOSIS — H35372 Puckering of macula, left eye: Secondary | ICD-10-CM

## 2022-11-20 DIAGNOSIS — Z7985 Long-term (current) use of injectable non-insulin antidiabetic drugs: Secondary | ICD-10-CM

## 2022-11-20 DIAGNOSIS — I10 Essential (primary) hypertension: Secondary | ICD-10-CM | POA: Diagnosis not present

## 2022-12-07 ENCOUNTER — Encounter: Payer: 59 | Admitting: Student

## 2022-12-07 NOTE — Progress Notes (Deleted)
   CC: Diabetes follow-up  HPI:  [name] is a 52 y.o. male living with a history stated below and presents today for diabetes follow-up. Please see problem based assessment and plan for additional details.  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     blood glucose meter kit and supplies KIT Dispense based on patient and insurance preference. Use up to four times daily as directed. (FOR ICD-9 250.00, 250.01). 1 each 0   Continuous Blood Gluc Sensor (FREESTYLE LIBRE 3 SENSOR) MISC 1 each by Does not apply route every 14 (fourteen) days. Place 1 sensor on the skin every 14 days. Use to check glucose continuously 2 each 11   ibuprofen (ADVIL,MOTRIN) 200 MG tablet Take 600 mg by mouth every 12 (twelve) hours.      Insulin Degludec-Liraglutide (XULTOPHY) 100-3.6 UNIT-MG/ML SOPN Inject 30 Units into the skin daily. 15 mL 2   lisinopril-hydrochlorothiazide (ZESTORETIC) 20-12.5 MG tablet Take 2 tablets by mouth daily. 60 tablet 5   metFORMIN (GLUCOPHAGE) 1000 MG tablet Take 1 tablet (1,000 mg total) by mouth 2 (two) times daily with a meal. 60 tablet 2   rosuvastatin (CRESTOR) 10 MG tablet Take 1 tablet (10 mg total) by mouth daily. 30 tablet 11   sertraline (ZOLOFT) 50 MG tablet Take 1 tablet by mouth once daily 30 tablet 0   sildenafil (VIAGRA) 100 MG tablet Take 1 tablet (100 mg total) by mouth as needed for erectile dysfunction. 10 tablet 0   No current facility-administered medications on file prior to visit.     Review of Systems: ROS negative except for what is noted on the assessment and plan.  There were no vitals filed for this visit.  Physical Exam: Constitutional: Well appearing, not in acute distress. Cardiovascular: regular rate and  rhythm, no m/r/g Pulmonary/Chest: normal work of breathing on room air, lungs clear to auscultation bilaterally Abdominal: soft, non-tender, non-distended  Assessment & Plan:  Diabetes mellitus with diabetic neuropathy, with long-term current use of insulin ([facility]) -Last A1c 04/2022> 14..  Patient is on Dexcom sensor. -Fasting blood glucose Patient presenting today as he was unable to start personal CGM after last visit as he did not have the reader for this. He typically does not use his cell phone for extraneous things. He has been doing well with the Xultophy 30 units daily. He is also taking metformin 1000mg  twice daily - states he had some loose stool initially with this but that has resolved.    -  - Continue Xultophy 30u daily - Metformin 1000mg  twice daily - referral to diabetes and nutrition services.   Patient {GC/GE:[id number]::"discussed with","seen with"} Dr. {[id number]::"[id number]","G. Hoffman","Mullen","Narendra","Vincent","Guilloud","Lau","Machen"}  No problem-specific Assessment & Plan notes found for this encounter.   Jason Bolster, MD [facility] Internal Medicine, PGY-1 Phone: [phone] Date 12/07/2022 Time 7:44 AM

## 2022-12-25 ENCOUNTER — Encounter (INDEPENDENT_AMBULATORY_CARE_PROVIDER_SITE_OTHER): Payer: 59 | Admitting: Ophthalmology

## 2023-02-08 ENCOUNTER — Other Ambulatory Visit (HOSPITAL_COMMUNITY): Payer: Self-pay

## 2023-02-11 ENCOUNTER — Other Ambulatory Visit (HOSPITAL_COMMUNITY): Payer: Self-pay

## 2023-03-22 ENCOUNTER — Ambulatory Visit: Payer: 59 | Admitting: Podiatry
